# Patient Record
Sex: Female | Born: 1990 | Race: Black or African American | Hispanic: No | Marital: Married | Smoking: Never smoker
Health system: Southern US, Community
[De-identification: ages and names within clinical notes are randomized; demographics above are authoritative.]

## PROBLEM LIST (undated history)

## (undated) DIAGNOSIS — N939 Abnormal uterine and vaginal bleeding, unspecified: Secondary | ICD-10-CM

## (undated) DIAGNOSIS — R112 Nausea with vomiting, unspecified: Secondary | ICD-10-CM

## (undated) DIAGNOSIS — G43909 Migraine, unspecified, not intractable, without status migrainosus: Secondary | ICD-10-CM

## (undated) DIAGNOSIS — D509 Iron deficiency anemia, unspecified: Secondary | ICD-10-CM

## (undated) DIAGNOSIS — F32A Depression, unspecified: Secondary | ICD-10-CM

## (undated) DIAGNOSIS — G43109 Migraine with aura, not intractable, without status migrainosus: Secondary | ICD-10-CM

## (undated) DIAGNOSIS — E669 Obesity, unspecified: Secondary | ICD-10-CM

## (undated) DIAGNOSIS — K219 Gastro-esophageal reflux disease without esophagitis: Secondary | ICD-10-CM

## (undated) DIAGNOSIS — F419 Anxiety disorder, unspecified: Secondary | ICD-10-CM

## (undated) DIAGNOSIS — Z9889 Other specified postprocedural states: Secondary | ICD-10-CM

## (undated) HISTORY — DX: Migraine with aura, not intractable, without status migrainosus: G43.109

## (undated) HISTORY — DX: Abnormal uterine and vaginal bleeding, unspecified: N93.9

## (undated) HISTORY — DX: Depression, unspecified: F32.A

## (undated) HISTORY — DX: Anxiety disorder, unspecified: F41.9

## (undated) HISTORY — DX: Migraine, unspecified, not intractable, without status migrainosus: G43.909

## (undated) HISTORY — PX: WISDOM TOOTH EXTRACTION: SHX21

## (undated) HISTORY — PX: ABDOMINAL HYSTERECTOMY: SHX81

---

## 2012-06-19 HISTORY — PX: TUBAL LIGATION: SHX77

## 2013-06-19 HISTORY — PX: DILATION AND CURETTAGE, DIAGNOSTIC / THERAPEUTIC: SUR384

## 2016-12-30 ENCOUNTER — Encounter (HOSPITAL_COMMUNITY): Payer: Self-pay | Admitting: Emergency Medicine

## 2016-12-30 ENCOUNTER — Emergency Department (HOSPITAL_COMMUNITY)
Admission: EM | Admit: 2016-12-30 | Discharge: 2016-12-30 | Disposition: A | Discharge status: Home / Self Care | Payer: No Typology Code available for payment source | Attending: Emergency Medicine | Admitting: Emergency Medicine

## 2016-12-30 DIAGNOSIS — G43909 Migraine, unspecified, not intractable, without status migrainosus: Secondary | ICD-10-CM

## 2016-12-30 DIAGNOSIS — Y92410 Unspecified street and highway as the place of occurrence of the external cause: Secondary | ICD-10-CM | POA: Insufficient documentation

## 2016-12-30 DIAGNOSIS — Y939 Activity, unspecified: Secondary | ICD-10-CM | POA: Insufficient documentation

## 2016-12-30 DIAGNOSIS — Y999 Unspecified external cause status: Secondary | ICD-10-CM | POA: Diagnosis not present

## 2016-12-30 DIAGNOSIS — M7918 Myalgia, other site: Secondary | ICD-10-CM

## 2016-12-30 DIAGNOSIS — S39012A Strain of muscle, fascia and tendon of lower back, initial encounter: Secondary | ICD-10-CM

## 2016-12-30 DIAGNOSIS — M791 Myalgia: Secondary | ICD-10-CM | POA: Diagnosis not present

## 2016-12-30 DIAGNOSIS — S3992XA Unspecified injury of lower back, initial encounter: Secondary | ICD-10-CM | POA: Diagnosis not present

## 2016-12-30 DIAGNOSIS — S3982XA Other specified injuries of lower back, initial encounter: Secondary | ICD-10-CM | POA: Diagnosis present

## 2016-12-30 DIAGNOSIS — S3092XA Unspecified superficial injury of abdominal wall, initial encounter: Secondary | ICD-10-CM | POA: Diagnosis not present

## 2016-12-30 MED ORDER — SUMATRIPTAN SUCCINATE 6 MG/0.5ML ~~LOC~~ SOLN
6.0000 mg | Freq: Once | SUBCUTANEOUS | Status: AC
  Administered 2016-12-30: 6 mg via SUBCUTANEOUS
  Filled 2016-12-30: qty 0.5

## 2016-12-30 MED ORDER — CYCLOBENZAPRINE HCL 5 MG PO TABS: 10.0000 mg | ORAL_TABLET | Freq: Three times a day (TID) | ORAL | 0 refills | Status: DC | PRN

## 2016-12-30 MED ORDER — CYCLOBENZAPRINE HCL 10 MG PO TABS
5.0000 mg | ORAL_TABLET | Freq: Once | ORAL | Status: AC
  Administered 2016-12-30: 5 mg via ORAL
  Filled 2016-12-30: qty 1

## 2016-12-30 MED ORDER — IBUPROFEN 400 MG PO TABS
600.0000 mg | ORAL_TABLET | Freq: Once | ORAL | Status: AC
  Administered 2016-12-30: 600 mg via ORAL
  Filled 2016-12-30: qty 1

## 2016-12-30 MED ORDER — IBUPROFEN 600 MG PO TABS: 600.0000 mg | ORAL_TABLET | Freq: Four times a day (QID) | ORAL | 0 refills | Status: DC | PRN
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
## 2016-12-30 NOTE — ED Notes (Signed)
ED Provider at bedside. 
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
## 2017-01-04 ENCOUNTER — Encounter: Payer: Self-pay | Admitting: Adult Health

## 2017-01-04 ENCOUNTER — Ambulatory Visit (INDEPENDENT_AMBULATORY_CARE_PROVIDER_SITE_OTHER): Payer: No Typology Code available for payment source | Admitting: Adult Health

## 2017-01-04 VITALS — BP 110/70 | HR 108 | Temp 99.0°F | Wt 252.8 lb

## 2017-01-04 DIAGNOSIS — R109 Unspecified abdominal pain: Secondary | ICD-10-CM | POA: Diagnosis not present

## 2017-01-04 DIAGNOSIS — Z7689 Persons encountering health services in other specified circumstances: Secondary | ICD-10-CM
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
## 2017-01-04 NOTE — Patient Instructions (Signed)
It was great meeting you today   I will follow up with you regarding your blood work and cat scan   Please follow up with me for your complete physical

## 2017-01-05 ENCOUNTER — Ambulatory Visit (INDEPENDENT_AMBULATORY_CARE_PROVIDER_SITE_OTHER)
Admission: RE | Admit: 2017-01-05 | Discharge: 2017-01-05 | Disposition: A | Discharge status: Home / Self Care | Payer: No Typology Code available for payment source | Source: Ambulatory Visit | Attending: Adult Health | Admitting: Adult Health

## 2017-01-05 ENCOUNTER — Other Ambulatory Visit: Payer: Self-pay | Admitting: Adult Health

## 2017-01-05 DIAGNOSIS — R109 Unspecified abdominal pain: Secondary | ICD-10-CM | POA: Diagnosis not present

## 2017-01-05 LAB — CBC WITH DIFFERENTIAL/PLATELET
BASOS PCT: 1.3 % (ref 0.0–3.0)
Basophils Absolute: 0.1 10*3/uL (ref 0.0–0.1)
EOS PCT: 1.5 % (ref 0.0–5.0)
Eosinophils Absolute: 0.1 10*3/uL (ref 0.0–0.7)
HCT: 39.7 % (ref 36.0–46.0)
Hemoglobin: 12.7 g/dL (ref 12.0–15.0)
LYMPHS ABS: 2.2 10*3/uL (ref 0.7–4.0)
Lymphocytes Relative: 23.7 % (ref 12.0–46.0)
MCHC: 31.9 g/dL (ref 30.0–36.0)
MCV: 87.3 fl (ref 78.0–100.0)
MONOS PCT: 7.3 % (ref 3.0–12.0)
Monocytes Absolute: 0.7 10*3/uL (ref 0.1–1.0)
NEUTROS ABS: 6.3 10*3/uL (ref 1.4–7.7)
NEUTROS PCT: 66.2 % (ref 43.0–77.0)
PLATELETS: 331 10*3/uL (ref 150.0–400.0)
RBC: 4.55 Mil/uL (ref 3.87–5.11)
RDW: 13.7 % (ref 11.5–15.5)
WBC: 9.5 10*3/uL (ref 4.0–10.5)

## 2017-01-05 LAB — BASIC METABOLIC PANEL
BUN: 13 mg/dL (ref 6–23)
CALCIUM: 9 mg/dL (ref 8.4–10.5)
CO2: 29 meq/L (ref 19–32)
CREATININE: 0.75 mg/dL (ref 0.40–1.20)
Chloride: 103 mEq/L (ref 96–112)
GFR: 120.61 mL/min (ref >60.00)
Glucose, Bld: 88 mg/dL (ref 70–99)
Potassium: 4.2 mEq/L (ref 3.5–5.1)
Sodium: 138 mEq/L (ref 135–145)

## 2017-01-05 MED ORDER — IOPAMIDOL (ISOVUE-300) INJECTION 61%: 100.0000 mL | Freq: Once | INTRAVENOUS | Status: DC | PRN

## 2017-01-25 ENCOUNTER — Encounter: Payer: No Typology Code available for payment source | Admitting: Adult Health

## 2017-02-15 ENCOUNTER — Encounter: Payer: No Typology Code available for payment source | Admitting: Adult Health

## 2017-03-09 ENCOUNTER — Encounter: Payer: Self-pay | Admitting: Adult Health

## 2017-03-11 ENCOUNTER — Encounter (HOSPITAL_COMMUNITY): Payer: Self-pay | Admitting: Emergency Medicine

## 2017-03-11 ENCOUNTER — Emergency Department (HOSPITAL_COMMUNITY)
Admission: EM | Admit: 2017-03-11 | Discharge: 2017-03-11 | Disposition: A | Discharge status: Home / Self Care | Payer: Self-pay | Attending: Emergency Medicine | Admitting: Emergency Medicine

## 2017-03-11 ENCOUNTER — Emergency Department (HOSPITAL_COMMUNITY): Payer: Self-pay

## 2017-03-11 DIAGNOSIS — Z79899 Other long term (current) drug therapy: Secondary | ICD-10-CM | POA: Insufficient documentation

## 2017-03-11 DIAGNOSIS — Y998 Other external cause status: Secondary | ICD-10-CM | POA: Insufficient documentation

## 2017-03-11 DIAGNOSIS — W101XXA Fall (on)(from) sidewalk curb, initial encounter: Secondary | ICD-10-CM | POA: Insufficient documentation

## 2017-03-11 DIAGNOSIS — S8392XA Sprain of unspecified site of left knee, initial encounter: Secondary | ICD-10-CM | POA: Insufficient documentation

## 2017-03-11 DIAGNOSIS — Y92480 Sidewalk as the place of occurrence of the external cause: Secondary | ICD-10-CM | POA: Insufficient documentation

## 2017-03-11 DIAGNOSIS — Y9302 Activity, running: Secondary | ICD-10-CM | POA: Insufficient documentation

## 2017-03-11 MED ORDER — NAPROXEN 500 MG PO TABS: 500.0000 mg | ORAL_TABLET | Freq: Two times a day (BID) | ORAL | 0 refills | Status: DC

## 2017-03-11 NOTE — ED Triage Notes (Signed)
Pt states she was running, she fell and twisted her left knee pt having 9/10 pain at this time, no deformity noticed on triage.

## 2017-03-11 NOTE — ED Provider Notes (Signed)
[facility] Provider Note   CSN: [id number] Arrival date & time: 03/11/17  0148     History   Chief Complaint Chief Complaint  Patient presents with  . Knee Injury    HPI Lori [name] is a 26 y.o. female.  Patient presents with complaint of acute onset of left knee pain starting at 6 PM yesterday. Patient states that she was running and fell on a sidewalk. She twisted her left knee and landed on her right knee. She has taken ibuprofen prior to arrival without relief. No numbness or tingling distal to the knee. She is able to walk but it hurts. The onset of this condition was acute. The course is constant. Aggravating factors: none. Alleviating factors: none.        Past Medical History:  Diagnosis Date  . Migraine     There are no active problems to display for this patient.   Past Surgical History:  Procedure Laterality Date  . ABLATION  2015  . TUBAL LIGATION  2014    OB History    No data available       Home Medications    Prior to Admission medications   Medication Sig Start Date End Date Taking? Authorizing Provider  amitriptyline (ELAVIL) 10 MG tablet Take 10 mg by mouth at bedtime.    [provider]  naproxen (NAPROSYN) 500 MG tablet Take 1 tablet (500 mg total) by mouth 2 (two) times daily. 03/11/17   Renne Crigler, PA-C  rizatriptan (MAXALT) 10 MG tablet Take 10 mg by mouth as needed for migraine. May repeat in 2 hours if needed    [provider]    Family History Family History  Problem Relation Age of Onset  . Diabetes Mother   . Heart disease Mother   . Diabetes Father   . Kidney disease Father   . Asthma Sister   . Seizures Sister   . Asthma Daughter   . Von Willebrand disease Son   . Asthma Sister   . Diabetes Maternal Grandmother   . Heart disease Maternal Grandmother   . Diabetes Maternal Grandfather   . Diabetes Paternal Grandmother   . Diabetes Paternal Grandfather     Social History Social  History  Substance Use Topics  . Smoking status: Never Smoker  . Smokeless tobacco: Never Used  . Alcohol use Yes     Comment: social     Allergies   Benylin adult formula [dextromethorphan]; Codeine; and Tramadol   Review of Systems Review of Systems  Constitutional: Negative for activity change.  Musculoskeletal: Positive for arthralgias and gait problem. Negative for back pain, joint swelling and neck pain.  Skin: Negative for wound.  Neurological: Negative for weakness and numbness.     Physical Exam Updated Vital Signs BP 119/61 (BP Location: Right Arm)   Pulse 89   Temp 97.6 F (36.4 C) (Oral)   Resp 17   Ht  (1.651 m)   Wt 111.1 kg (245 lb)   SpO2 100%   BMI 40.77 kg/m   Physical Exam  Constitutional: She appears well-developed and well-nourished.  HENT:  Head: Normocephalic and atraumatic.  Mouth/Throat: Oropharynx is clear and moist.  Eyes: Pupils are equal, round, and reactive to light.  Neck: Normal range of motion. Neck supple.  Cardiovascular: Normal pulses.  Exam reveals no decreased pulses.   Musculoskeletal: She exhibits tenderness. She exhibits no edema.       Left hip: She exhibits normal range of  motion, normal strength and no tenderness.       Right knee: She exhibits normal range of motion, no swelling and no effusion.       Left knee: She exhibits normal range of motion, no swelling and no effusion. Tenderness found. Medial joint line and lateral joint line tenderness noted.       Left ankle: She exhibits normal range of motion and no swelling. No tenderness.  Neurological: She is alert. No sensory deficit.  Motor, sensation, and vascular distal to the injury is fully intact.   Skin: Skin is warm and dry.  Psychiatric: She has a normal mood and affect.  Nursing note and vitals reviewed.    ED Treatments / Results   Radiology Dg Knee Complete 4 Views Left  Result Date: 03/11/2017 CLINICAL DATA:  Patient fell yesterday at 6 p.m.  Twisting injury to the left knee. Left knee pain. EXAM: LEFT KNEE - COMPLETE 4+ VIEW COMPARISON:  None. FINDINGS: No evidence of fracture, dislocation, or joint effusion. No evidence of arthropathy or other focal bone abnormality. Soft tissues are unremarkable. IMPRESSION: Negative. Electronically Signed   By: Burman Nieves M.D.   On: 03/11/2017 03:23    Procedures Procedures (including critical care time)  Medications Ordered in ED Medications - No data to display   Initial Impression / Assessment and Plan / ED Course  I have reviewed the triage vital signs and the nursing notes.  Pertinent labs & imaging results that were available during my care of the patient were reviewed by me and considered in my medical decision making (see chart for details).     Patient seen and examined. Patient updated on x-ray results. Will provide with Ace wrap and crutches. Encouraged orthopedic follow-up if not improved in one week.  Vital signs reviewed and are as follows: BP 119/61 (BP Location: Right Arm)   Pulse 89   Temp 97.6 F (36.4 C) (Oral)   Resp 17   Ht  (1.651 m)   Wt 111.1 kg (245 lb)   SpO2 100%   BMI 40.77 kg/m   Patient was counseled on RICE protocol and told to rest injury, use ice for no longer than 15 minutes every hour, compress the area, and elevate above the level of their heart as much as possible to reduce swelling. Questions answered. Patient verbalized understanding.     Final Clinical Impressions(s) / ED Diagnoses   Final diagnoses:  Sprain of left knee, unspecified ligament, initial encounter   Patient with twisting injury of left knee. Distal extremity is neurovascularly intact. Suspect sprain. Rice protocol indicated.   New Prescriptions New Prescriptions   NAPROXEN (NAPROSYN) 500 MG TABLET    Take 1 tablet (500 mg total) by mouth 2 (two) times daily.     Renne Crigler, PA-C 03/11/17 1610    Margarita Grizzle, MD 03/12/17 [phone]

## 2017-03-11 NOTE — Discharge Instructions (Signed)
Please read and follow all provided instructions.  Your diagnoses today include:  1. Sprain of left knee, unspecified ligament, initial encounter     Tests performed today include:  An x-ray of the affected area - does NOT show any broken bones  Vital signs. See below for your results today.   Medications prescribed:   Naproxen - anti-inflammatory pain medication  Do not exceed  naproxen every 12 hours, take with food  You have been prescribed an anti-inflammatory medication or NSAID. Take with food. Take smallest effective dose for the shortest duration needed for your pain. Stop taking if you experience stomach pain or vomiting.   Take any prescribed medications only as directed.  Home care instructions:   Follow any educational materials contained in this packet  Follow R.I.C.E. Protocol:  R - rest your injury   I  - use ice on injury without applying directly to skin  C - compress injury with bandage or splint  E - elevate the injury as much as possible  Follow-up instructions: Please follow-up with your primary care provider or the provided orthopedic physician (bone specialist) if you continue to have significant pain in 1 week. In this case you may have a more severe injury that requires further care.   Return instructions:   Please return if your toes or feet are numb or tingling, appear gray or blue, or you have severe pain (also elevate the leg and loosen splint or wrap if you were given one)  Please return to the Emergency Department if you experience worsening symptoms.   Please return if you have any other emergent concerns.  Additional Information:  Your vital signs today were: BP 119/61 (BP Location: Right Arm)    Pulse 89    Temp 97.6 F (36.4 C) (Oral)    Resp 17    Ht  (1.651 m)    Wt 111.1 kg (245 lb)    SpO2 100%    BMI 40.77 kg/m  If your blood pressure (BP) was elevated above 135/85 this visit, please have this repeated by your  doctor within one month. -------------- If prescribed crutches for your injury: use crutches with non-weight bearing for the first few days. Then, you may walk as the pain allows, or as instructed. Start gradually with weight bearing on the affected side. Once you can walk pain free, then try jogging. When you can run forwards, then you can try moving side-to-side. If you cannot walk without crutches in one week, you need a re-check. --------------

## 2017-03-27 DIAGNOSIS — R51 Headache: Secondary | ICD-10-CM | POA: Diagnosis not present

## 2017-03-27 DIAGNOSIS — S3992XA Unspecified injury of lower back, initial encounter: Secondary | ICD-10-CM | POA: Diagnosis not present

## 2017-03-27 DIAGNOSIS — M549 Dorsalgia, unspecified: Secondary | ICD-10-CM | POA: Diagnosis not present

## 2017-03-27 DIAGNOSIS — S8992XA Unspecified injury of left lower leg, initial encounter: Secondary | ICD-10-CM | POA: Diagnosis not present

## 2017-03-27 DIAGNOSIS — M542 Cervicalgia: Secondary | ICD-10-CM | POA: Diagnosis not present

## 2017-03-27 DIAGNOSIS — S199XXA Unspecified injury of neck, initial encounter: Secondary | ICD-10-CM | POA: Diagnosis not present

## 2017-03-27 DIAGNOSIS — S0990XA Unspecified injury of head, initial encounter: Secondary | ICD-10-CM | POA: Diagnosis not present

## 2017-03-27 DIAGNOSIS — M25562 Pain in left knee: Secondary | ICD-10-CM | POA: Diagnosis not present

## 2017-03-28 MED FILL — IBUPROFEN 800 MG TABS: 800 | 7 days supply | Qty: 30 | Fill #0

## 2017-03-28 MED FILL — METHOCARBAMOL 500 MG TABS: 500 | 10 days supply | Qty: 40 | Fill #0

## 2017-04-03 ENCOUNTER — Encounter: Payer: Self-pay | Admitting: Adult Health

## 2017-04-03 ENCOUNTER — Ambulatory Visit (INDEPENDENT_AMBULATORY_CARE_PROVIDER_SITE_OTHER): Payer: 59 | Admitting: Adult Health

## 2017-04-03 VITALS — BP 90/70 | Temp 98.3°F | Ht 65.5 in | Wt 257.0 lb

## 2017-04-03 DIAGNOSIS — Z23 Encounter for immunization: Secondary | ICD-10-CM | POA: Diagnosis not present

## 2017-04-03 DIAGNOSIS — Z0000 Encounter for general adult medical examination without abnormal findings: Secondary | ICD-10-CM | POA: Diagnosis not present

## 2017-04-03 LAB — LIPID PANEL
Cholesterol: 164 mg/dL (ref 0–200)
HDL: 46.9 mg/dL (ref >39.00)
LDL CALC: 107 mg/dL — AB (ref 0–99)
NONHDL: 117.04
Total CHOL/HDL Ratio: 3
Triglycerides: 51 mg/dL (ref 0.0–149.0)
VLDL: 10.2 mg/dL (ref 0.0–40.0)

## 2017-04-03 LAB — HEPATIC FUNCTION PANEL
ALBUMIN: 3.9 g/dL (ref 3.5–5.2)
ALK PHOS: 112 U/L (ref 39–117)
ALT: 18 U/L (ref 0–35)
AST: 12 U/L (ref 0–37)
Bilirubin, Direct: 0.1 mg/dL (ref 0.0–0.3)
Total Bilirubin: 0.3 mg/dL (ref 0.2–1.2)
Total Protein: 6.9 g/dL (ref 6.0–8.3)

## 2017-04-03 LAB — CBC WITH DIFFERENTIAL/PLATELET
BASOS PCT: 0.7 % (ref 0.0–3.0)
Basophils Absolute: 0 10*3/uL (ref 0.0–0.1)
EOS PCT: 1.7 % (ref 0.0–5.0)
Eosinophils Absolute: 0.1 10*3/uL (ref 0.0–0.7)
HCT: 40.8 % (ref 36.0–46.0)
HEMOGLOBIN: 13.2 g/dL (ref 12.0–15.0)
LYMPHS ABS: 2.2 10*3/uL (ref 0.7–4.0)
Lymphocytes Relative: 29.5 % (ref 12.0–46.0)
MCHC: 32.4 g/dL (ref 30.0–36.0)
MCV: 86.4 fl (ref 78.0–100.0)
MONO ABS: 0.6 10*3/uL (ref 0.1–1.0)
Monocytes Relative: 7.7 % (ref 3.0–12.0)
NEUTROS ABS: 4.4 10*3/uL (ref 1.4–7.7)
NEUTROS PCT: 60.4 % (ref 43.0–77.0)
PLATELETS: 310 10*3/uL (ref 150.0–400.0)
RBC: 4.72 Mil/uL (ref 3.87–5.11)
RDW: 13.2 % (ref 11.5–15.5)
WBC: 7.3 10*3/uL (ref 4.0–10.5)

## 2017-04-03 LAB — BASIC METABOLIC PANEL
BUN: 14 mg/dL (ref 6–23)
CO2: 27 mEq/L (ref 19–32)
Calcium: 9 mg/dL (ref 8.4–10.5)
Chloride: 104 mEq/L (ref 96–112)
Creatinine, Ser: 0.63 mg/dL (ref 0.40–1.20)
GFR: 147.21 mL/min (ref >60.00)
GLUCOSE: 89 mg/dL (ref 70–99)
Potassium: 4.4 mEq/L (ref 3.5–5.1)
SODIUM: 138 meq/L (ref 135–145)

## 2017-04-03 LAB — TSH: TSH: 1.04 u[IU]/mL (ref 0.35–4.50)

## 2017-04-03 LAB — HEMOGLOBIN A1C: HEMOGLOBIN A1C: 5.5 % (ref 4.6–6.5)

## 2017-04-03 NOTE — Progress Notes (Signed)
Subjective:    Patient ID: Lori [name], female    DOB: 22-Apr-1991, 26 y.o.   MRN: [id number]  HPI  Patient presents for yearly preventative medicine examination. She is a pleasant 26 year old female who  has a past medical history of Migraine.  Migraines - She takes Maxalt and Amitriptyline when needed - stable   All immunizations and health maintenance protocols were reviewed with the patient and needed orders were placed.  Appropriate screening laboratory values were ordered for the patient including screening of hyperlipidemia, renal function and hepatic function.  Medication reconciliation,  past medical history, social history, problem list and allergies were reviewed in detail with the patient  Goals were established with regard to weight loss, exercise, and  diet in compliance with medications. She is not exercising on a regular basis nor does she always follow a heart healthy diet.   Wt Readings from Last 3 Encounters:  04/03/17 257 lb (116.6 kg)  03/11/17 245 lb (111.1 kg)  01/04/17 252 lb 12.8 oz (114.7 kg)   She will follow up with gyn for her pap smear  Review of Systems  Constitutional: Negative.   HENT: Negative.   Eyes: Negative.   Respiratory: Negative.   Cardiovascular: Negative.   Gastrointestinal: Negative.   Endocrine: Negative.   Genitourinary: Negative.   Musculoskeletal: Negative.   Skin: Negative.   Allergic/Immunologic: Negative.   Neurological: Negative.   Psychiatric/Behavioral: Negative.   All other systems reviewed and are negative.  Past Medical History:  Diagnosis Date  . Migraine     Social History   Social History  . Marital status: Single    Spouse name: N/A  . Number of children: N/A  . Years of education: N/A   Occupational History  . Not on file.   Social History Main Topics  . Smoking status: Never Smoker  . Smokeless tobacco: Never Used  . Alcohol use Yes     Comment: social  . Drug use: No  . Sexual  activity: Not on file   Other Topics Concern  . Not on file   Social History Narrative   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   Two children ( daughter 67 , son 3)           Past Surgical History:  Procedure Laterality Date  . ABLATION  2015  . TUBAL LIGATION  2014    Family History  Problem Relation Age of Onset  . Diabetes Mother   . Heart disease Mother   . Diabetes Father   . Kidney disease Father   . Asthma Sister   . Seizures Sister   . Asthma Daughter   . Von Willebrand disease Son   . Asthma Sister   . Diabetes Maternal Grandmother   . Heart disease Maternal Grandmother   . Diabetes Maternal Grandfather   . Diabetes Paternal Grandmother   . Diabetes Paternal Grandfather     Allergies  Allergen Reactions  . Benylin Adult Formula [Dextromethorphan] Shortness Of Breath  . Benadryl [Diphenhydramine Hcl] Hives  . Codeine Itching  . Tramadol Itching    Current Outpatient Prescriptions on File Prior to Visit  Medication Sig Dispense Refill  . amitriptyline (ELAVIL) 10 MG tablet Take 10 mg by mouth at bedtime.    . naproxen (NAPROSYN) 500 MG tablet Take 1 tablet (500 mg total) by mouth 2 (two) times daily. 30 tablet 0  . rizatriptan (MAXALT) 10 MG tablet Take 10 mg  by mouth as needed for migraine. May repeat in 2 hours if needed     No current facility-administered medications on file prior to visit.     BP 90/70 (BP Location: Right Arm)   Temp 98.3 F (36.8 C) (Oral)   Ht 5' 5.5" (1.664 m)   Wt 257 lb (116.6 kg)   BMI 42.12 kg/m       Objective:   Physical Exam  Constitutional: She is oriented to person, place, and time. She appears well-developed and well-nourished. No distress.  Obese   HENT:  Head: Normocephalic and atraumatic.  Right Ear: External ear normal.  Left Ear: External ear normal.  Nose: Nose normal.  Mouth/Throat: Oropharynx is clear and moist. No oropharyngeal exudate.  Eyes: Pupils are equal, round, and reactive to  light. Conjunctivae and EOM are normal. Right eye exhibits no discharge. Left eye exhibits no discharge. No scleral icterus.  Neck: Normal range of motion. Neck supple. No JVD present. No tracheal deviation present. No thyromegaly present.  Cardiovascular: Normal rate, regular rhythm, normal heart sounds and intact distal pulses.  Exam reveals no gallop and no friction rub.   No murmur heard. Pulmonary/Chest: Effort normal and breath sounds normal. No stridor. No respiratory distress. She has no wheezes. She has no rales. She exhibits no tenderness.  Abdominal: Soft. Bowel sounds are normal. She exhibits no distension and no mass. There is no tenderness. There is no rebound and no guarding.  Musculoskeletal: Normal range of motion. She exhibits no edema, tenderness or deformity.  Lymphadenopathy:    She has no cervical adenopathy.  Neurological: She is alert and oriented to person, place, and time. She has normal reflexes. She displays normal reflexes. No cranial nerve deficit. She exhibits normal muscle tone. Coordination normal.  Skin: Skin is warm and dry. No rash noted. She is not diaphoretic. No erythema. No pallor.  Psychiatric: She has a normal mood and affect. Her behavior is normal. Judgment and thought content normal.  Nursing note and vitals reviewed.     Assessment & Plan:  1. Routine general medical examination at a health care facility - CBC with Differential/Platelet - Basic metabolic panel - Hemoglobin A1c - Hepatic function panel - Lipid panel - TSH  2. Need for influenza vaccination  - Flu Vaccine QUAD 6+ mos PF IM (Fluarix Quad PF)  3. Need for Tdap vaccination  - Tdap vaccine greater than or equal to 7yo IM  4. Morbid obesity (HCC) - Encouraged lift style modification    Shirline Frees, NP

## 2017-04-03 NOTE — Patient Instructions (Signed)
It was great seeing you today   I will follow up with you regarding your labs   Please follow up with GYN

## 2017-04-04 ENCOUNTER — Other Ambulatory Visit (HOSPITAL_COMMUNITY)
Admission: RE | Admit: 2017-04-04 | Discharge: 2017-04-04 | Disposition: A | Discharge status: Home / Self Care | Payer: 59 | Source: Ambulatory Visit | Attending: Obstetrics and Gynecology | Admitting: Obstetrics and Gynecology

## 2017-04-04 ENCOUNTER — Ambulatory Visit (INDEPENDENT_AMBULATORY_CARE_PROVIDER_SITE_OTHER): Payer: 59 | Admitting: Obstetrics and Gynecology

## 2017-04-04 ENCOUNTER — Encounter: Payer: Self-pay | Admitting: Obstetrics and Gynecology

## 2017-04-04 VITALS — BP 112/78 | HR 84 | Resp 16 | Ht 65.0 in | Wt 255.0 lb

## 2017-04-04 DIAGNOSIS — Z113 Encounter for screening for infections with a predominantly sexual mode of transmission: Secondary | ICD-10-CM | POA: Insufficient documentation

## 2017-04-04 DIAGNOSIS — N898 Other specified noninflammatory disorders of vagina: Secondary | ICD-10-CM

## 2017-04-04 DIAGNOSIS — G43109 Migraine with aura, not intractable, without status migrainosus: Secondary | ICD-10-CM

## 2017-04-04 DIAGNOSIS — Z124 Encounter for screening for malignant neoplasm of cervix: Secondary | ICD-10-CM | POA: Diagnosis not present

## 2017-04-04 DIAGNOSIS — Z01419 Encounter for gynecological examination (general) (routine) without abnormal findings: Secondary | ICD-10-CM | POA: Diagnosis not present

## 2017-04-04 DIAGNOSIS — Z30432 Encounter for removal of intrauterine contraceptive device: Secondary | ICD-10-CM

## 2017-04-04 NOTE — Addendum Note (Signed)
Addended by: Shelda JakesHANNER, MARTHA E on: 04/04/2017 12:54 PM   Modules accepted: Orders

## 2017-04-04 NOTE — Progress Notes (Signed)
26 y.o. [id number] SingleAfrican AmericanF here for annual exam. Patient also wants to discuss having her IUD removed and having a tubal reversal. She had a PP tubal ligation in 11/14 after the birth of her son. From 11/14-5/16 she was having continued heavy bleeding. She had a D&C in 5/16 and had the mirena placed a few weeks later. Since the mirena was placed she has monthly spotting with severe cramps. She didn't have these cramps prior to the IUD. Cramps up to an 8/10 in severity, ibuprofen helps. Prior to her children her cycles were normal. She would like the IUD pulled.  She is sexually active, no dyspareunia. Same partner x 1 year, interested in tubal ligation.      No LMP recorded. Patient is not currently having periods (Reason: IUD).          Sexually active: Yes.    The current method of family planning is IUD/ tubal ligation.    Exercising: No.  The patient does not participate in regular exercise at present. Smoker:  no  Health Maintenance: Pap:  2016 WNL per patient  History of abnormal Pap:  no TDaP:  04-03-17 Gardasil: N/A   reports that she has never smoked. She has never used smokeless tobacco. She reports that she drinks alcohol. She reports that she does not use drugs. She is a Lawyer[employer]NA.  Daughter is 8 and son is 4.   Past Medical History:  Diagnosis Date  . Abnormal uterine bleeding   . Migraine with aura   fractured her coccyx with birth of her daughter.   Past Surgical History:  Procedure Laterality Date  . DILATION AND CURETTAGE, DIAGNOSTIC / THERAPEUTIC  2015  . TUBAL LIGATION  2014    Current Outpatient Prescriptions  Medication Sig Dispense Refill  . levonorgestrel (MIRENA) 20 MCG/24HR IUD 1 each by Intrauterine route once.    . naproxen (NAPROSYN) 500 MG tablet Take 1 tablet (500 mg total) by mouth 2 (two) times daily. 30 tablet 0  . rizatriptan (MAXALT) 10 MG tablet Take 10 mg by mouth as needed for migraine. May repeat in 2 hours if needed     No current  facility-administered medications for this visit.     Family History  Problem Relation Age of Onset  . Diabetes Mother   . Heart disease Mother   . Diabetes Father   . Kidney disease Father   . Asthma Sister   . Seizures Sister   . Asthma Daughter   . Von Willebrand disease Son   . Asthma Sister   . Diabetes Maternal Grandmother   . Heart disease Maternal Grandmother   . Diabetes Maternal Grandfather   . Diabetes Paternal Grandmother   . Lupus Paternal Grandmother   . Diabetes Paternal Grandfather   Son's Von Willebrand Disease, from his Dad's Side.   Review of Systems  Constitutional: Negative.   HENT: Negative.   Eyes: Negative.   Respiratory: Negative.   Cardiovascular: Negative.   Gastrointestinal: Negative.   Endocrine: Negative.   Genitourinary: Negative.   Musculoskeletal: Negative.   Skin: Negative.   Allergic/Immunologic: Negative.   Neurological: Negative.   Psychiatric/Behavioral: Negative.     Exam:   BP 112/78 (BP Location: Right Arm, Patient Position: Sitting, Cuff Size: Large)   Pulse 84   Resp 16   Ht 5\' 5"  (1.651 m)   Wt 255 lb (115.7 kg)   BMI 42.43 kg/m   Weight change: @WEIGHTCHANGE @ Height:   Height: 5\' 5"  (165.1 cm)  Ht Readings from Last 3 Encounters:  04/04/17 5\' 5"  (1.651 m)  04/03/17 5' 5.5" (1.664 m)  03/11/17 5\' 5"  (1.651 m)    General appearance: alert, cooperative and appears stated age Head: Normocephalic, without obvious abnormality, atraumatic Neck: no adenopathy, supple, symmetrical, trachea midline and thyroid normal to inspection and palpation Lungs: clear to auscultation bilaterally Cardiovascular: regular rate and rhythm Breasts: normal appearance, no masses or tenderness Abdomen: soft, non-tender; non distended,  no masses,  no organomegaly Extremities: extremities normal, atraumatic, no cyanosis or edema Skin: Skin color, texture, turgor normal. No rashes or lesions Lymph nodes: Cervical, supraclavicular, and  axillary nodes normal. No abnormal inguinal nodes palpated Neurologic: Grossly normal   Pelvic: External genitalia:  no lesions              Urethra:  normal appearing urethra with no masses, tenderness or lesions              Bartholins and Skenes: normal                 Vagina: normal appearing vagina with an increase in watery, white vaginal D/C (on questioning the patient does c/o an increase in d/c, denies odor)              Cervix: no lesions, IUD string 2 cm, IUD removed               Bimanual Exam:  Uterus:  normal size, contour, position, consistency, mobility, non-tender              Adnexa: no mass, fullness, tenderness               Rectovaginal: Confirms               Anus:  normal sphincter tone, no lesions  Chaperone was present for exam.  A:  Well Woman with normal exam  Severe cramps since the IUD was placed  Desires tubal reversal  Vaginal discharge  P:   Pap with reflex hpv  Vaginitis panel  STD testing  Remove IUD  Calendar cycles, f/u if not normal  Will refer to Dr April Manson for consultation for tubal reversal  Discussed breast self exam  Discussed calcium and vit D intake  Discussed Gardasil, information given   CC: Dr April Manson

## 2017-04-04 NOTE — Patient Instructions (Signed)
EXERCISE AND DIET:  We recommended that you start or continue a regular exercise program for good health. Regular exercise means any activity that makes your heart beat faster and makes you sweat.  We recommend exercising at least 30 minutes per day at least 3 days a week, preferably 4 or 5.  We also recommend a diet low in fat and sugar.  Inactivity, poor dietary choices and obesity can cause diabetes, heart attack, stroke, and kidney damage, among others.    ALCOHOL AND SMOKING:  Women should limit their alcohol intake to no more than 7 drinks/beers/glasses of wine (combined, not each!) per week. Moderation of alcohol intake to this level decreases your risk of breast cancer and liver damage. And of course, no recreational drugs are part of a healthy lifestyle.  And absolutely no smoking or even second hand smoke. Most people know smoking can cause heart and lung diseases, but did you know it also contributes to weakening of your bones? Aging of your skin?  Yellowing of your teeth and nails?  CALCIUM AND VITAMIN D:  Adequate intake of calcium and Vitamin D are recommended.  The recommendations for exact amounts of these supplements seem to change often, but generally speaking 600 mg of calcium (either carbonate or citrate) and 800 units of Vitamin D per day seems prudent. Certain women may benefit from higher intake of Vitamin D.  If you are among these women, your doctor will have told you during your visit.    PAP SMEARS:  Pap smears, to check for cervical cancer or precancers,  have traditionally been done yearly, although recent scientific advances have shown that most women can have pap smears less often.  However, every woman still should have a physical exam from her gynecologist every year. It will include a breast check, inspection of the vulva and vagina to check for abnormal growths or skin changes, a visual exam of the cervix, and then an exam to evaluate the size and shape of the uterus and  ovaries.  And after 25 years of age, a rectal exam is indicated to check for rectal cancers. We will also provide age appropriate advice regarding health maintenance, like when you should have certain vaccines, screening for sexually transmitted diseases, bone density testing, colonoscopy, mammograms, etc.   MAMMOGRAMS:  All women over 40 years old should have a yearly mammogram. Many facilities now offer a "3D" mammogram, which may cost around $50 extra out of pocket. If possible,  we recommend you accept the option to have the 3D mammogram performed.  It both reduces the number of women who will be called back for extra views which then turn out to be normal, and it is better than the routine mammogram at detecting truly abnormal areas.    COLONOSCOPY:  Colonoscopy to screen for colon cancer is recommended for all women at age 50.  We know, you hate the idea of the prep.  We agree, BUT, having colon cancer and not knowing it is worse!!  Colon cancer so often starts as a polyp that can be seen and removed at colonscopy, which can quite literally save your life!  And if your first colonoscopy is normal and you have no family history of colon cancer, most women don't have to have it again for 10 years.  Once every ten years, you can do something that may end up saving your life, right?  We will be happy to help you get it scheduled when you are ready.    Be sure to check your insurance coverage so you understand how much it will cost.  It may be covered as a preventative service at no cost, but you should check your particular policy.     Tubal Ligation Reversal Tubal ligation is a procedure that closes the fallopian tubes to prevent pregnancy. Tubal ligation reversal is the reopening, untying, or reconnecting of separated sections of the fallopian tubes, and reversing the tubal ligation procedure that was originally performed. Success rates of getting pregnant after this procedure depend on:  Your  age.  The type of tubal ligation that you had.  The length of your remaining fallopian tubes.  How healthy the tubes are.  The amount of scar tissue in your pelvic area.  Your partner's fertility.  Tell a health care provider about:  Any allergies you have.  All medicines you are taking, including vitamins, herbs, eye drops, creams, and over-the-counter medicines. This includes any use of steroids, either by mouth or in cream form.  Previous problems you or members of your family have had with the use of anesthetics.  Any blood disorders you have.  Previous surgeries you have had.  Any medical conditions you may have.  Possibility of pregnancy, if this applies. What are the risks?  Failure to reverse the original procedure.  Future blockage of a reopened or reconnected fallopian tube.  Not getting pregnant.  Ectopic pregnancy.  Infection.  Bleeding.  Injury to surrounding organs.  Blood clots in the legs and chest.  Side effects from anesthetics. What happens before the procedure?  You and your partner will likely need physical exams to make sure that you do not have any unknown fertility problems. Blood and imaging tests may also be performed for the same reason.  Ask your health care provider about: ? Changing or stopping your regular medicines. This is especially important if you are taking diabetes medicines or blood thinners. ? Taking medicines such as aspirin and ibuprofen. These medicines can thin your blood. Do not take these medicines before your procedure if your health care provider instructs you not to.  Follow instructions from your health care provider about eating and drinking restrictions.  Plan to have someone take you home after the procedure.  If you go home right after the procedure, plan to have someone with you for 24 hours. What happens during the procedure?  You will be given one or more of the following: ? A medicine that helps  you relax (sedative). ? A medicine that numbs the area (local anesthetic). ? A medicine that makes you fall asleep (general anesthetic). ? A medicine that is injected into an area of your body that numbs everything below the injection site (regional anesthetic).  If you have been given general anesthetic, a tube will be put down your throat to help you breathe.  A thin, lighted tube (laparoscope) with a camera attached will be inserted near your belly button and into the pelvic area. Other instruments will be inserted through small cuts (incisions) in your abdomen.  Clips, rings, or clamps that closed your fallopian tubes will be removed with the surgical instruments. Unconnected sections of your fallopian tubes will be reconnected.  Your fallopian tubes will be tested to make sure that they are open along their whole length.  The incisions will be closed with stitches (sutures).  A bandage (dressing) will be placed over the incisions. The procedure may vary among health care providers and hospitals. What happens after the procedure?  Your blood pressure, heart rate, breathing rate, and blood oxygen level will be monitored often until the medicines you were given have worn off.  You will be given pain medicine as needed.  If you had general anesthetic, you may have some mild discomfort in your throat. This is from the breathing tube that was placed in your throat while you were sleeping.  You will have some mild abdominal discomfort for 3-7 days.  You will need to have a follow-up X-ray dye test about 3-4 months after the surgery to make sure that your tubes are open and working. This information is not intended to replace advice given to you by your health care provider. Make sure you discuss any questions you have with your health care provider. Document Released: 12/05/2011 Document Revised: 05/04/2016 Document Reviewed: 09/16/2011 Elsevier Interactive Patient Education  [employer]2018  Elsevier Inc.

## 2017-04-05 ENCOUNTER — Other Ambulatory Visit: Payer: Self-pay

## 2017-04-05 LAB — HEP, RPR, HIV PANEL
HEP B S AG: NEGATIVE
HIV Screen 4th Generation wRfx: NONREACTIVE
RPR Ser Ql: NONREACTIVE

## 2017-04-05 LAB — HEPATITIS C ANTIBODY: Hep C Virus Ab: <0.1 s/co ratio (ref 0.0–0.9)

## 2017-04-05 LAB — VAGINITIS/VAGINOSIS, DNA PROBE
CANDIDA SPECIES: NEGATIVE
Gardnerella vaginalis: POSITIVE — AB
TRICHOMONAS VAG: NEGATIVE

## 2017-04-05 MED ORDER — METRONIDAZOLE 500 MG PO TABS: 500.0000 mg | ORAL_TABLET | Freq: Two times a day (BID) | ORAL | 0 refills | Status: DC

## 2017-04-05 MED FILL — metroNIDAZOLE 500 MG TABS: 500 | 7 days supply | Qty: 14 | Fill #0

## 2017-04-06 LAB — CYTOLOGY - PAP
CHLAMYDIA, DNA PROBE: NEGATIVE
DIAGNOSIS: UNDETERMINED — AB
HPV (WINDOPATH): DETECTED — AB
NEISSERIA GONORRHEA: NEGATIVE

## 2017-04-08 ENCOUNTER — Telehealth: Payer: Self-pay | Admitting: Obstetrics and Gynecology

## 2017-04-08 NOTE — Telephone Encounter (Signed)
After hours phone call from patient.   Calling with heavy vaginal bleeding.  Had Mirena IUD removed 4 days ago due to pain.  Hx of bilateral tubal ligation.   Reports 10 pad changes yesterday.  Having clotting today. Cramping with clotting.  Taking Ibuprofen 800 mg.  Felt fatigued yesterday.   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  I recommend she be seen for evaluation.  She will decide if [facility] today or call in the am for an appointment.

## 2017-04-09 ENCOUNTER — Encounter (HOSPITAL_COMMUNITY): Payer: Self-pay | Admitting: *Deleted

## 2017-04-09 ENCOUNTER — Inpatient Hospital Stay (HOSPITAL_COMMUNITY)
Admission: AD | Admit: 2017-04-09 | Discharge: 2017-04-09 | Disposition: A | Discharge status: Home / Self Care | Payer: 59 | Source: Ambulatory Visit | Attending: Gynecology | Admitting: Gynecology

## 2017-04-09 DIAGNOSIS — Z3202 Encounter for pregnancy test, result negative: Secondary | ICD-10-CM | POA: Insufficient documentation

## 2017-04-09 DIAGNOSIS — N939 Abnormal uterine and vaginal bleeding, unspecified: Secondary | ICD-10-CM | POA: Insufficient documentation

## 2017-04-09 LAB — CBC
HCT: 39.4 % (ref 36.0–46.0)
HEMOGLOBIN: 13.2 g/dL (ref 12.0–15.0)
MCH: 28.5 pg (ref 26.0–34.0)
MCHC: 33.5 g/dL (ref 30.0–36.0)
MCV: 85.1 fL (ref 78.0–100.0)
PLATELETS: 334 10*3/uL (ref 150–400)
RBC: 4.63 MIL/uL (ref 3.87–5.11)
RDW: 13.6 % (ref 11.5–15.5)
WBC: 6.9 10*3/uL (ref 4.0–10.5)

## 2017-04-09 LAB — POCT PREGNANCY, URINE: PREG TEST UR: NEGATIVE

## 2017-04-09 MED ORDER — MEGESTROL ACETATE 40 MG PO TABS: 40.0000 mg | ORAL_TABLET | Freq: Two times a day (BID) | ORAL | 5 refills | Status: DC

## 2017-04-09 NOTE — Discharge Instructions (Signed)

## 2017-04-09 NOTE — MAU Provider Note (Signed)
Chief Complaint:  Vaginal Bleeding; Abdominal Pain; and Headache   First Provider Initiated Contact with Patient 04/09/17 1919       HPI: Lori [name] is a 26 y.o. [id number] who presents to maternity admissions reporting vaginal bleeding.  Had an IUD placed for bleeding issues but it caused her pain for 2 years so had it removed.  Since then has been having heavy bleeding with clots and cramps.. She reports vaginal bleeding, but no vaginal itching/burning, urinary symptoms, h/a, dizziness, n/v, or fever/chills.    Vaginal Bleeding  The patient's primary symptoms include pelvic pain and vaginal bleeding. The patient's pertinent negatives include no genital itching, genital lesions or genital odor. This is a recurrent problem. The current episode started in the past 7 days. The problem occurs constantly. The problem has been unchanged. She is not pregnant. Associated symptoms include abdominal pain and headaches. The vaginal discharge was bloody. The vaginal bleeding is heavier than menses. She has been passing clots. She has not been passing tissue. Nothing aggravates the symptoms. She has tried nothing for the symptoms.  Abdominal Pain  This is a recurrent problem. The current episode started in the past 7 days. The onset quality is gradual. The problem has been unchanged. The pain is mild. The quality of the pain is cramping. The abdominal pain does not radiate. Associated symptoms include headaches. Nothing aggravates the pain. The pain is relieved by nothing. She has tried nothing for the symptoms.  Headache   This is a recurrent problem. The problem occurs intermittently. The problem has been resolved. The patient is experiencing no pain. Associated symptoms include abdominal pain.    RN Note: Has had a tubal ligation 2014. Was having bleeding issues, has IUD placed to help control bleeding. Went to GYN on 10/17, IUD removed that day because of pain.  From THurs to today,  Started  bleeding, changing every 1-2 hrs, passing dime sized and bigger clots.  Feels drained.  Having headaches, off and on- not currently.  Called on call phys yesterday.  Talked to them today, was told to come in and have labs checked, bleeding evaluated.  Past Medical History: Past Medical History:  Diagnosis Date  . Abnormal uterine bleeding   . Migraine with aura     Past obstetric history: OB History  Gravida Para Term Preterm AB Living  3 2 2   1 2   SAB TAB Ectopic Multiple Live Births  1       2    # Outcome Date GA Lbr Len/2nd Weight Sex Delivery Anes PTL Lv  3 Term      Vag-Spont   LIV  2 SAB           1 Term      Vag-Spont   LIV      Past Surgical History: Past Surgical History:  Procedure Laterality Date  . DILATION AND CURETTAGE, DIAGNOSTIC / THERAPEUTIC  2015  . TUBAL LIGATION  2014    Family History: Family History  Problem Relation Age of Onset  . Diabetes Mother   . Heart disease Mother   . Diabetes Father   . Kidney disease Father   . Asthma Sister   . Seizures Sister   . Asthma Daughter   . Von Willebrand disease Son   . Asthma Sister   . Diabetes Maternal Grandmother   . Heart disease Maternal Grandmother   . Diabetes Maternal Grandfather   . Diabetes Paternal Grandmother   . Lupus  Paternal Grandmother   . Diabetes Paternal Grandfather     Social History: Social History  Substance Use Topics  . Smoking status: Never Smoker  . Smokeless tobacco: Never Used  . Alcohol use Yes     Comment: social    Allergies:  Allergies  Allergen Reactions  . Benylin Adult Formula [Dextromethorphan] Shortness Of Breath  . Benadryl [Diphenhydramine Hcl] Hives  . Codeine Itching  . Tdap [Diphth-Acell Pertussis-Tetanus]   . Tramadol Itching    Meds:  Prescriptions Prior to Admission  Medication Sig Dispense Refill Last Dose  . levonorgestrel (MIRENA) 20 MCG/24HR IUD 1 each by Intrauterine route once.   Taking  . metroNIDAZOLE (FLAGYL) 500 MG tablet Take  1 tablet (500 mg total) by mouth 2 (two) times daily. 14 tablet 0   . naproxen (NAPROSYN) 500 MG tablet Take 1 tablet (500 mg total) by mouth 2 (two) times daily. 30 tablet 0 Taking  . rizatriptan (MAXALT) 10 MG tablet Take 10 mg by mouth as needed for migraine. May repeat in 2 hours if needed   Taking    I have reviewed patient's Past Medical Hx, Surgical Hx, Family Hx, Social Hx, medications and allergies.  ROS:  Review of Systems  Gastrointestinal: Positive for abdominal pain.  Genitourinary: Positive for pelvic pain and vaginal bleeding.  Neurological: Positive for headaches.   Other systems negative     Physical Exam  Patient Vitals for the past 24 hrs:  BP Temp Temp src Pulse Resp SpO2 Weight  04/09/17 1753 128/81 98.3 F (36.8 C) Oral 91 18 100 % 253 lb 4 oz (114.9 kg)   Constitutional: Well-developed, well-nourished female in no acute distress.  Cardiovascular: normal rate and rhythm, no ectopy audible, S1 & S2 heard, no murmur Respiratory: normal effort, no distress. Lungs CTAB with no wheezes or crackles GI: Abd soft, non-tender.  Nondistended.  No rebound, No guarding.  Bowel Sounds audible  MS: Extremities nontender, no edema, normal ROM Neurologic: Alert and oriented x 4.   Grossly nonfocal. GU: Neg CVAT. Skin:  Warm and Dry Psych:  Affect appropriate.  PELVIC EXAM: Cervix pink, visually closed, without lesion, small to moderate bloody discharge, vaginal walls and external genitalia normal Bimanual exam: Cervix firm, anterior, neg CMT, uterus nontender, nonenlarged, adnexa without tenderness, enlargement, or mass    Labs: Results for orders placed or performed during the hospital encounter of 04/09/17 (from the past 24 hour(s))  Pregnancy, urine POC     Status: None   Collection Time: 04/09/17  6:21 PM  Result Value Ref Range   Preg Test, Ur NEGATIVE NEGATIVE  CBC     Status: None   Collection Time: 04/09/17  7:28 PM  Result Value Ref Range   WBC 6.9 4.0 -  10.5 K/uL   RBC 4.63 3.87 - 5.11 MIL/uL   Hemoglobin 13.2 12.0 - 15.0 g/dL   HCT 16.1 09.6 - 04.5 %   MCV 85.1 78.0 - 100.0 fL   MCH 28.5 26.0 - 34.0 pg   MCHC 33.5 30.0 - 36.0 g/dL   RDW 40.9 81.1 - 91.4 %   Platelets 334 150 - 400 K/uL    Imaging:    MAU Course/MDM: I have ordered labs as follows: CBC, UPT.  Normal HGB,  Neg UPT Imaging ordered: none Results reviewed.   Consult Dr Audie Box with presentation, results and exam findings.   Treatments in MAU included none.   Pt stable at time of discharge.  Assessment: Abnormal uterine bleeding  Plan: Discharge home Recommend followup in office Rx sent for Megace for AUB   Encouraged to return here or to other Urgent Care/ED if she develops worsening of symptoms, increase in pain, fever, or other concerning symptoms.   Wynelle BourgeoisMarie Tranell Wojtkiewicz CNM, MSN Certified Nurse-Midwife 04/09/2017 7:31 PM

## 2017-04-09 NOTE — MAU Note (Addendum)
Has had a tubal ligation 2014. Was having bleeding issues, has IUD placed to help control bleeding. Went to GYN on 10/17, IUD removed that day because of pain.  From THurs to today,  Started bleeding, changing every 1-2 hrs, passing dime sized and bigger clots.  Feels drained.  Having headaches, off and on- not currently.  Called on call phys yesterday.  Talked to them today, was told to come in and have labs checked, bleeding evaluated.

## 2017-04-09 NOTE — Telephone Encounter (Signed)
Called to patient. She is still bleeding heavy and passing clots. She is saturating 1-2  pads with-in an hour. Patient states she spoke with Dr. Edward JollySilva yesterday and Dr. Edward JollySilva advised patient to go to MAU last night. Patient states she couldn't miss work so she did not go. She is going to go to MAU when her aunt gets off work at 4 pm. She will call us back tomorrow for a follow up-eh

## 2017-04-09 NOTE — Telephone Encounter (Signed)
Please call and check on the patient. She had her IUD removed last week and called the on call MD yesterday with heavy bleeding.

## 2017-04-10 MED FILL — MEGESTROL 40 MG TABLET: 40 | 30 days supply | Qty: 60 | Fill #0

## 2017-04-10 NOTE — Telephone Encounter (Signed)
Patient wants to speak with the nurse. She has questions regarding a medication.

## 2017-04-10 NOTE — Telephone Encounter (Signed)
Spoke with patient. States she was [facility], was advised to start Megace, medication was not sent to correct pharmacy. Patient request Meg[facility] to [facility] to [facility]. Advised patient she may call [facility]. Reports bleeding is unchanged, changing saturated pad q1 hours with clotting. Reports fatigue; denies SHOB, weakness, lightheadedness. Pharmacy updated.   Advised patient to call to transfer RX, will review with Dr. Oscar LaJertson and return call, patient verbalizes understanding and is agreeable.   Reviewed with Dr. Oscar LaJertson, have patient start Megace as recommended, schedule f/u for next week.    Call returned to patient. [facility], was able to [facility]. Advised as seen above per Dr. Oscar LaJertson. Scheduled for OV on 10/29 at 2:30pm. Advised to return call to office for any additional questions/concerns. ER precautions reviewed. Patient verbalizes understanding and si agreeable.   Routing to provider for final review. Patient is agreeable to disposition. Will close encounter.

## 2017-04-12 ENCOUNTER — Telehealth: Payer: Self-pay

## 2017-04-12 NOTE — Telephone Encounter (Signed)
Left message to call Shaquinta Peruski at [phone]. 

## 2017-04-12 NOTE — Telephone Encounter (Signed)
Spoke with patient. Patient states that she is changing a large pad every hour due to it being full front to back. States since starting the Megace she has developed a headache. Had to remove contacts and feels her vision is blurry. Advised will need to review with MD regarding switching from Megace to Aygestin. Advised unsure how much this will cost with insurance. Patient will check with her pharmacy and contact the office to let Dr.Jertson know if this is reasonably priced after being sent in if needed. Aware will need to get bleeding stopped before scheduling colposcopy. Patient is agreeable.

## 2017-04-12 NOTE — Telephone Encounter (Signed)
Spoke with patient. Advised of results as seen below from Dr.Jertson. Patient verbalizes understanding. Patient reports her IUD was removed in office on 04/04/2017. On 04/09/2017 patient was seen [facility] for [facility]. Was started on Megace 40 mg bid until bleeding stops then decrease to one per day. States she is still taking 2 per day. Bleeding has slightly decreased. Has follow up on 04/16/2017 with Dr.Jertson. Advised will review with Dr.Jertson regarding recommendations for scheduling and return call.

## 2017-04-12 NOTE — Telephone Encounter (Signed)
Patient returning call.

## 2017-04-12 NOTE — Telephone Encounter (Signed)
-----   Message from Romualdo BolkJill Evelyn Jertson, MD sent at 04/11/2017  4:08 PM EDT ----- Please inform the patient ASCUS, +HPV pap and set her up for a colposcopy

## 2017-04-12 NOTE — Telephone Encounter (Signed)
Spoke with patient after review with Dr.Jertson. Dr.Jertson recommends that the [facility] be [facility] for evaluation of ongoing heavy bleeding with taking Megace, headaches, and vision changes. Patient is asking if she can wait to be seen tomorrow. Advised will need to be seen today for evaluation and will need to head to the hospital at this time. Patient verbalizes understanding.

## 2017-04-13 NOTE — Telephone Encounter (Signed)
Please check on her

## 2017-04-13 NOTE — Telephone Encounter (Signed)
Attempted to reach patient at number provided, [phone] there was no answer and the mailbox is full.

## 2017-04-16 ENCOUNTER — Encounter: Payer: Self-pay | Admitting: Obstetrics and Gynecology

## 2017-04-16 ENCOUNTER — Ambulatory Visit (INDEPENDENT_AMBULATORY_CARE_PROVIDER_SITE_OTHER): Payer: 59 | Admitting: Obstetrics and Gynecology

## 2017-04-16 VITALS — BP 112/72 | HR 92 | Resp 18 | Wt 257.0 lb

## 2017-04-16 DIAGNOSIS — N939 Abnormal uterine and vaginal bleeding, unspecified: Secondary | ICD-10-CM

## 2017-04-16 NOTE — Telephone Encounter (Signed)
Patient here for follow up appointment- Patient states bleeding has stopped. See OV notes -eh

## 2017-04-16 NOTE — Progress Notes (Signed)
GYNECOLOGY  VISIT   HPI: 26 y.o.   Single  African American  female   [phone] with Patient's last menstrual period was 04/04/2017.   here for follow up on AUB. Patient bled for 10 days straight after having Mirena ID removed on 04/04/17. IUD was removed for pain,  Nothing but spotting with the IUD in place. The IUD was originally placed after a D&C in 5/16 which was done for abnormal bleeding.  She had a normal pelvic CT in 7/18 for another reason.  She was started on Megace on 04/09/17 when seen at [facility] for heavy vaginal bleeding. The megace was giving her headaches so she stopped it on Thursday, causing blurry vision. Bleeding stopped on Saturday. Headaches are on and off, no visual changes. When she was bleeding, she was going through 1-2 pads an hour (then reports going through 48 pads in 6-7 days).  She was only having spotting with the mirena. She has had a recent normal TSH and negative UPT. Currently feeling fine. She was drained and sleeping a lot when she was bleeding. She was cramping with the bleeding as well.   She has an appointment with Dr April MansonYalcinkaya in January to discuss tubal reversal.   GYNECOLOGIC HISTORY: Patient's last menstrual period was 04/04/2017. Contraception:tunal Ligation  Menopausal hormone therapy: none         OB History    Gravida Para Term Preterm AB Living   3 2 2   1 2    SAB TAB Ectopic Multiple Live Births   1       2         Patient Active Problem List   Diagnosis Date Noted  . Migraine with aura     Past Medical History:  Diagnosis Date  . Abnormal uterine bleeding   . Migraine with aura     Past Surgical History:  Procedure Laterality Date  . DILATION AND CURETTAGE, DIAGNOSTIC / THERAPEUTIC  2015  . TUBAL LIGATION  2014    Current Outpatient Prescriptions  Medication Sig Dispense Refill  . megestrol (MEGACE) 40 MG tablet Take 1 tablet (40 mg total) by mouth 2 (two) times daily. Until bleeding slows then one per day 60 tablet 5  .  naproxen (NAPROSYN) 500 MG tablet Take 1 tablet (500 mg total) by mouth 2 (two) times daily. 30 tablet 0  . rizatriptan (MAXALT) 10 MG tablet Take 10 mg by mouth as needed for migraine. May repeat in 2 hours if needed     No current facility-administered medications for this visit.      ALLERGIES: Benylin adult formula [dextromethorphan]; Benadryl [diphenhydramine hcl]; Codeine; Tdap [diphth-acell pertussis-tetanus]; and Tramadol  Family History  Problem Relation Age of Onset  . Diabetes Mother   . Heart disease Mother   . Diabetes Father   . Kidney disease Father   . Asthma Sister   . Seizures Sister   . Asthma Daughter   . Von Willebrand disease Son   . Asthma Sister   . Diabetes Maternal Grandmother   . Heart disease Maternal Grandmother   . Diabetes Maternal Grandfather   . Diabetes Paternal Grandmother   . Lupus Paternal Grandmother   . Diabetes Paternal Grandfather     Social History   Social History  . Marital status: Single    Spouse name: N/A  . Number of children: N/A  . Years of education: N/A   Occupational History  . Not on file.   Social History Main Topics  .  Smoking status: Never Smoker  . Smokeless tobacco: Never Used  . Alcohol use Yes     Comment: social  . Drug use: No  . Sexual activity: Yes    Partners: Male    Birth control/ protection: Surgical   Other Topics Concern  . Not on file   Social History Narrative   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   Two children ( daughter 36 , son 3)           Review of Systems  Constitutional: Negative.   HENT: Negative.   Eyes: Negative.   Respiratory: Negative.   Cardiovascular: Negative.   Gastrointestinal: Negative.   Genitourinary: Negative.   Musculoskeletal: Negative.   Skin: Negative.   Neurological: Negative.   Endo/Heme/Allergies: Negative.   Psychiatric/Behavioral: Negative.     PHYSICAL EXAMINATION:    BP 112/72 (BP Location: Right Arm, Patient Position: Sitting, Cuff  Size: Normal)   Pulse 92   Resp 18   Wt 257 lb (116.6 kg)   LMP 04/04/2017   BMI 42.77 kg/m     General appearance: alert, cooperative and appears stated age Abdomen: soft, non-tender; non distended, no masses,  no organomegaly   ASSESSMENT Episode of very heavy bleeding for 10 days after having the mirena IUD removed. Normal TSH, normal CBC at the beginning of the bleeding, negative UPT Desires tubal reversal, has a consultation set up    PLAN CBC Return for gyn ultrasound   An After Visit Summary was printed and given to the patient.  ~15 minutes face to face time of which over 50% was spent in counseling.

## 2017-04-17 LAB — CBC
HEMATOCRIT: 38.8 % (ref 34.0–46.6)
HEMOGLOBIN: 12.6 g/dL (ref 11.1–15.9)
MCH: 27.8 pg (ref 26.6–33.0)
MCHC: 32.5 g/dL (ref 31.5–35.7)
MCV: 86 fL (ref 79–97)
Platelets: 333 10*3/uL (ref 150–379)
RBC: 4.54 x10E6/uL (ref 3.77–5.28)
RDW: 13.5 % (ref 12.3–15.4)
WBC: 8.1 10*3/uL (ref 3.4–10.8)

## 2017-05-08 ENCOUNTER — Encounter: Payer: Self-pay | Admitting: Obstetrics and Gynecology

## 2017-05-08 ENCOUNTER — Ambulatory Visit (INDEPENDENT_AMBULATORY_CARE_PROVIDER_SITE_OTHER): Payer: 59

## 2017-05-08 ENCOUNTER — Ambulatory Visit (INDEPENDENT_AMBULATORY_CARE_PROVIDER_SITE_OTHER): Payer: 59 | Admitting: Obstetrics and Gynecology

## 2017-05-08 ENCOUNTER — Other Ambulatory Visit: Payer: Self-pay

## 2017-05-08 VITALS — BP 124/78 | HR 92 | Resp 16 | Wt 258.0 lb

## 2017-05-08 DIAGNOSIS — N939 Abnormal uterine and vaginal bleeding, unspecified: Secondary | ICD-10-CM | POA: Diagnosis not present

## 2017-05-08 DIAGNOSIS — R87610 Atypical squamous cells of undetermined significance on cytologic smear of cervix (ASC-US): Secondary | ICD-10-CM | POA: Diagnosis not present

## 2017-05-08 NOTE — Progress Notes (Signed)
GYNECOLOGY  VISIT   HPI: 26 y.o.   Single  African American  female   [phone] with Patient's last menstrual period was 04/07/2017.   here for follow up abnormal uterine bleeding. The patient had very abnormal uterine bleeding after removal of a mirena IUD (removed secondary to pain). The IUD was originally placed in 2016 for cycle control.  She has had a tubal ligation and has an appointment to see Dr April MansonYalcinkaya in 1/19 to discuss tubal reversal. As part of her work up for the abnormal bleeding she had a negative UPT, negative GC/CT, normal CBC and TSH. She also had an ASCUS, +HPV pap and is due for a colposcopy. She hasn't had any bleeding since last month. Feeling okay. She has been getting migraines, swelling in her feet and nausea.    GYNECOLOGIC HISTORY: Patient's last menstrual period was 04/07/2017. Contraception:Tubal Ligation  Menopausal hormone therapy: none         OB History    Gravida Para Term Preterm AB Living   3 2 2   1 2    SAB TAB Ectopic Multiple Live Births   1       2         Patient Active Problem List   Diagnosis Date Noted  . Migraine with aura     Past Medical History:  Diagnosis Date  . Abnormal uterine bleeding   . Migraine with aura     Past Surgical History:  Procedure Laterality Date  . DILATION AND CURETTAGE, DIAGNOSTIC / THERAPEUTIC  2015  . TUBAL LIGATION  2014    Current Outpatient Medications  Medication Sig Dispense Refill  . rizatriptan (MAXALT) 10 MG tablet Take 10 mg by mouth as needed for migraine. May repeat in 2 hours if needed     No current facility-administered medications for this visit.      ALLERGIES: Benylin adult formula [dextromethorphan]; Benadryl [diphenhydramine hcl]; Codeine; Tdap [diphth-acell pertussis-tetanus]; and Tramadol  Family History  Problem Relation Age of Onset  . Diabetes Mother   . Heart disease Mother   . Diabetes Father   . Kidney disease Father   . Asthma Sister   . Seizures Sister   .  Asthma Daughter   . Von Willebrand disease Son   . Asthma Sister   . Diabetes Maternal Grandmother   . Heart disease Maternal Grandmother   . Diabetes Maternal Grandfather   . Diabetes Paternal Grandmother   . Lupus Paternal Grandmother   . Diabetes Paternal Grandfather     Social History   Socioeconomic History  . Marital status: Single    Spouse name: Not on file  . Number of children: Not on file  . Years of education: Not on file  . Highest education level: Not on file  Social Needs  . Financial resource strain: Not on file  . Food insecurity - worry: Not on file  . Food insecurity - inability: Not on file  . Transportation needs - medical: Not on file  . Transportation needs - non-medical: Not on file  Occupational History  . Not on file  Tobacco Use  . Smoking status: Never Smoker  . Smokeless tobacco: Never Used  Substance and Sexual Activity  . Alcohol use: Yes    Comment: social  . Drug use: No  . Sexual activity: Yes    Partners: Male    Birth control/protection: Surgical  Other Topics Concern  . Not on file  Social History Narrative   She  is a Agricultural engineernursing assistant    Not married   Two children ( daughter 488 , son 3)        Review of Systems  Constitutional: Negative.   HENT: Negative.   Eyes: Negative.   Respiratory: Negative.   Cardiovascular: Negative.   Gastrointestinal: Negative.   Genitourinary: Negative.   Musculoskeletal: Negative.   Skin: Negative.   Neurological: Negative.   Endo/Heme/Allergies: Negative.   Psychiatric/Behavioral: Negative.     PHYSICAL EXAMINATION:    BP 124/78 (BP Location: Right Arm, Patient Position: Sitting, Cuff Size: Normal)   Pulse 92   Resp 16   Wt 258 lb (117 kg)   LMP 04/07/2017   BMI 42.93 kg/m     General appearance: alert, cooperative and appears stated age  Ultrasound images reviewed with the patient.   ASSESSMENT Abnormal uterine bleeding after removal of her IUD, normal ultrasound and  labs H/O tubal ligation, wants reversal, has appointment with Dr April MansonYalcinkaya  H/O ASCUS/+HPV pap    PLAN Calendar bleeding F/U for colposcopy    An After Visit Summary was printed and given to the patient.  10 minutes face to face time of which over 50% was spent in counseling.

## 2017-05-08 NOTE — Patient Instructions (Signed)
Colposcopy  Colposcopy is a procedure to examine the lowest part of the uterus (cervix), the vagina, and the area around the vaginal opening (vulva) for abnormalities or signs of disease. The procedure is done using a lighted microscope or magnifying lens (colposcope). If any unusual cells are found during the procedure, your health care provider may remove a tissue sample for testing (biopsy). A colposcopy may be done if you:   Have an abnormal Pap test. A Pap test is a screening test that is used to check for signs of cancer or infection of the vagina, cervix, and uterus.   Have a Pap smear test in which you test positive for high-risk HPV (human papillomavirus).   Have a sore or lesion on your cervix.   Have genital warts on your vulva, vagina, or cervix.   Took certain medicines while pregnant, such as diethylstilbestrol (DES).   Have pain during sexual intercourse.   Have vaginal bleeding, especially after sexual intercourse.   Need to have a cervical polyp removed.   Need to have a lost intrauterine device (IUD) string located.    Let your health care provider know about:   Any allergies you have, including allergies to prescribed medicine, latex, or iodine.   All medicines you are taking, including vitamins, herbs, eye drops, creams, and over-the-counter medicines. Bring a list of all of your medicines to your appointment.   Any problems you or family members have had with anesthetic medicines.   Any blood disorders you have.   Any surgeries you have had.   Any medical conditions you have, such as pelvic inflammatory disease (PID) or endometrial disorder.   Any history of frequent fainting.   Your menstrual cycle and what form of birth control (contraception) you use.   Your medical history, including any prior cervical treatment.   Whether you are pregnant or may be pregnant.  What are the risks?  Generally, this is a safe procedure. However, problems may occur,  including:   Pain.   Infection, which may include a fever, bad-smelling discharge, or pelvic pain.   Bleeding or discharge.   Misdiagnosis.   Fainting and vasovagal reactions, but this is rare.   Allergic reactions to medicines.   Damage to other structures or organs.    What happens before the procedure?   If you have your menstrual period or will have it at the time of your procedure, tell your health care provider. A colposcopy typically is not done during menstruation.   Continue your contraceptive practices before and after the procedure.   For 24 hours before the colposcopy:  ? Do not douche.  ? Do not use tampons.  ? Do not use medicines, creams, or suppositories in the vagina.  ? Do not have sexual intercourse.   Ask your health care provider about:  ? Changing or stopping your regular medicines. This is especially important if you are taking diabetes medicines or blood thinners.  ? Taking medicines such as aspirin and ibuprofen. These medicines can thin your blood. Do not take these medicines before your procedure if your health care provider instructs you not to. It is likely that your health care provider will tell you to avoid taking aspirin or medicine that contains aspirin for 7 days before the procedure.   Follow instructions from your health care provider about eating or drinking restrictions. You will likely need to eat a regular diet the day of the procedure and not skip any meals.   You   may have an exam or testing. A pregnancy test will be taken on the day of the procedure.   You may have a blood or urine sample taken.   Plan to have someone take you home from the hospital or clinic.   If you will be going home right after the procedure, plan to have someone with you for 24 hours.  What happens during the procedure?   You will lie down on your back, with your feet in foot rests (stirrups).   A warmed and lubricated instrument (speculum) will be inserted into your vagina. The  speculum will be used to hold apart the walls of your vagina so your health care provider can see your cervix and the inside of your vagina.   A cotton swab will be used to place a small amount of liquid solution on the areas to be examined. This solution makes it easier to see abnormal cells. You may feel a slight burning during this part.   The colposcope will be used to scan the cervix with a bright white light. The colposcope will be held near your vulvaand will magnify your vulva, vagina, and cervix for easier examination.   Your health care provider may decide to take a biopsy. If so:  ? You may be given medicine to numb the area (local anesthetic).  ? Surgical instruments will be used to suck out mucus and cells through your vagina.  ? You may feel mild pain while the tissue sample is removed.  ? Bleeding may occur. A solution may be used to stop the bleeding.  ? If a sample of tissue is needed from the inside of the cervix, a different procedure called endocervical curettage (ECC) may be completed. During this procedure, a curved instrument (curette) will be used to scrape cells from your cervix or the top of your cervix (endocervix).   Your health care provider will record the location of any abnormalities.  The procedure may vary among health care providers and hospitals.  What happens after the procedure?   You will lie down and rest for a few minutes. You may be offered juice or cookies.   Your blood pressure, heart rate, breathing rate, and blood oxygen level will be monitored until any medicines you were given have worn off.   You may have to wear compression stockings. These stockings help to prevent blood clots and reduce swelling in your legs.   You may have some cramping in your abdomen. This should go away after a few minutes.  This information is not intended to replace advice given to you by your health care provider. Make sure you discuss any questions you have with your health care  provider.  Document Released: 08/26/2002 Document Revised: 02/01/2016 Document Reviewed: 01/10/2016  Elsevier Interactive Patient Education  2018 [employer]

## 2017-05-08 NOTE — Progress Notes (Signed)
Patient scheduled for colposcopy while in office. Contraception -tubal ligation. LMP 04/07/17.  Scheduled for 05/28/17 at 9am with Dr. Oscar LaJertson. Patient declined earlier appt offered. Advised to take Motrin 800 mg with food and water one hour before procedure.

## 2017-05-23 ENCOUNTER — Other Ambulatory Visit: Payer: Self-pay | Admitting: *Deleted

## 2017-05-23 DIAGNOSIS — R87610 Atypical squamous cells of undetermined significance on cytologic smear of cervix (ASC-US): Secondary | ICD-10-CM

## 2017-05-23 DIAGNOSIS — R87810 Cervical high risk human papillomavirus (HPV) DNA test positive: Principal | ICD-10-CM

## 2017-05-27 ENCOUNTER — Telehealth: Payer: Self-pay | Admitting: Obstetrics and Gynecology
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
## 2017-05-28 ENCOUNTER — Ambulatory Visit: Payer: Self-pay | Admitting: Obstetrics and Gynecology
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
## 2017-06-15 NOTE — Telephone Encounter (Signed)
Attempted to reach patient at number provided [phone], there was no answer and voicemail box is full. Need to reschedule patient's colposcopy. Patient has BTL.

## 2017-07-02 DIAGNOSIS — Z310 Encounter for reversal of previous sterilization: Secondary | ICD-10-CM | POA: Diagnosis not present

## 2017-07-04 NOTE — Telephone Encounter (Signed)
Left message to call Antonae Zbikowski at [phone]. 

## 2017-07-17 NOTE — Telephone Encounter (Signed)
Reschedule colposcopy 07/17/2017 12:51 PM    To: Etheleen SiaAndrea Danay [name]    From: Ginny ForthSprague, [name] E, RN    Created: 07/17/2017 12:51 PM     Lori LushAndrea,  Our office has attempted to reach you via telephone to assist with rescheduling your colposcopy appointment with Dr.Jertson. At this time we have not received a return call to get you scheduled. Please contact the office as soon as possible to schedule this important follow up.  Sincerely,  Nolen MuKaitlyn Erina Hamme, RN

## 2017-07-25 NOTE — Telephone Encounter (Signed)
Dr. Jertson, please advise. 

## 2017-07-25 NOTE — Telephone Encounter (Signed)
Please send a certified letter and close the encounter.

## 2017-07-26 ENCOUNTER — Telehealth: Payer: Self-pay | Admitting: Obstetrics and Gynecology

## 2017-07-26 NOTE — Telephone Encounter (Signed)
Spoke with patient, advised as seen below per Dr. Oscar LaJertson. OV scheduled for 07/27/17 at 4:15pm with Dr. Oscar LaJertson. OV cancelled for 2/14. Patient verbalizes understanding and is agreeable.   Routing to provider for final review. Patient is agreeable to disposition. Will close encounter.

## 2017-07-26 NOTE — Telephone Encounter (Signed)
Patient says she has been bleeding heavy and passing clots for a month.

## 2017-07-26 NOTE — Telephone Encounter (Signed)
Spoke with patient. Patient reports heavy bleeding and clots for 1 month with increased migraines. Reports lightheadedness and weakness with migraines. Has been taking Excedrin for migraine relief.   Wears pad and super plus tampon, changes tampon q3 hrs.   Denies pain, SHOB, N/V, fever/chills.   Recommended OV today with Dr. Oscar LaJertson, patient declined. Patient declined multiple OV offered, states can only come after 3:30pm or on 2/14. OV scheduled for 2/14 at 11:30am.  Advised patient did not recommend waiting until 2/14 for evaluation, again patient declined. ER precautions reviewed with patient. Patient asking what to do about migraines? Recommended OV for further evaluation of bleeding, patient can also f/u with PCP for migraines. Advised will review with Dr. Oscar LaJertson and return call with any additional recommendations.   Dr. Oscar LaJertson -please review.

## 2017-07-26 NOTE — Telephone Encounter (Signed)
Reviewed with Dr. Oscar LaJertson, recommended OV for further evaluation of bleeding. May schedule 2/8 at 4:15pm. Recommended f/u with PCP for migraines.   Call returned to patient, no answer, Left message to call Noreene LarssonJill at [phone].

## 2017-07-27 ENCOUNTER — Encounter: Payer: Self-pay | Admitting: Obstetrics and Gynecology

## 2017-07-27 ENCOUNTER — Ambulatory Visit: Payer: Self-pay | Admitting: Obstetrics and Gynecology

## 2017-07-27 ENCOUNTER — Ambulatory Visit (INDEPENDENT_AMBULATORY_CARE_PROVIDER_SITE_OTHER): Payer: Self-pay | Admitting: Obstetrics and Gynecology

## 2017-07-27 VITALS — BP 108/64 | HR 80 | Wt 262.8 lb

## 2017-07-27 DIAGNOSIS — Z8742 Personal history of other diseases of the female genital tract: Secondary | ICD-10-CM

## 2017-07-27 DIAGNOSIS — Z87898 Personal history of other specified conditions: Secondary | ICD-10-CM

## 2017-07-27 DIAGNOSIS — N921 Excessive and frequent menstruation with irregular cycle: Secondary | ICD-10-CM

## 2017-07-27 LAB — POCT URINE PREGNANCY: Preg Test, Ur: NEGATIVE

## 2017-07-27 MED ORDER — MEDROXYPROGESTERONE ACETATE 10 MG PO TABS: ORAL_TABLET | ORAL | 0 refills | Status: DC

## 2017-07-27 NOTE — Patient Instructions (Signed)

## 2017-07-27 NOTE — Progress Notes (Signed)
Patient scheduled while in office for colposcopy with Dr. Oscar LaJertson on 08/02/17 at 11:30am. Advised to take Motrin 800 mg with food and water one hour before procedure. Patient verbalizes understanding and is agreeable.

## 2017-07-27 NOTE — Progress Notes (Signed)
GYNECOLOGY  VISIT   HPI: 27 y.o.   Single  African American  female   [phone] with Patient's last menstrual period was 07/03/2017 (exact date).   here for heavy menstrual bleeding since 07-03-17.  The patient has a long h/o AUB. She had a D&C in 5/16 for AUB and then had an IUD placed. The IUD controlled her bleeding, but she had severe cramps, she didn't have the cramps prior to the IUD. She had the IUD pulled in 10/18. She had very heavy bleeding for 10 days after having the IUD removed (normal TSH, negative genprobe, normal CBC and negative UPT). Normal ultrasound in 11/18.  She had a cycle in November for one week, saturating a super tampon in 2 hours. Next Cycle was a month later around 12/14 x 7 days. Then cycle started when it was supposed to on 07/02/17 and is still bleeding. Very heavy, passing clots. Saturating a super tampon every hour. Having cramps intermittently. She c/o fatigue, sometimes lightheaded.  Same long term partner. No worries about STD's She is overdue for a colposcopy for an ASCUS,+HPV pap.  She is interested in tubal reversal and has seen Dr April MansonYalcinkaya for a consultation. They are talking about surgery.  She has a h/o migraines with aura. Headaches have worsened with her bleeding.  Mother had open heart surgery on 07/04/17.   GYNECOLOGIC HISTORY: Patient's last menstrual period was 07/03/2017 (exact date). Contraception: tubal Menopausal hormone therapy: none        OB History    Gravida Para Term Preterm AB Living   3 2 2   1 2    SAB TAB Ectopic Multiple Live Births   1       2         Patient Active Problem List   Diagnosis Date Noted  . Migraine with aura     Past Medical History:  Diagnosis Date  . Abnormal uterine bleeding   . Migraine with aura     Past Surgical History:  Procedure Laterality Date  . DILATION AND CURETTAGE, DIAGNOSTIC / THERAPEUTIC  2015  . TUBAL LIGATION  2014    Current Outpatient Medications  Medication Sig Dispense  Refill  . aspirin-acetaminophen-caffeine (EXCEDRIN MIGRAINE) 250-250-65 MG tablet Take by mouth every 6 (six) hours as needed for headache.    . rizatriptan (MAXALT) 10 MG tablet Take 10 mg by mouth as needed for migraine. May repeat in 2 hours if needed     No current facility-administered medications for this visit.      ALLERGIES: Benylin adult formula [dextromethorphan]; Benadryl [diphenhydramine hcl]; Codeine; Tdap [diphth-acell pertussis-tetanus]; and Tramadol  Family History  Problem Relation Age of Onset  . Diabetes Mother   . Heart disease Mother   . Diabetes Father   . Kidney disease Father   . Asthma Sister   . Seizures Sister   . Asthma Daughter   . Von Willebrand disease Son   . Asthma Sister   . Diabetes Maternal Grandmother   . Heart disease Maternal Grandmother   . Diabetes Maternal Grandfather   . Diabetes Paternal Grandmother   . Lupus Paternal Grandmother   . Diabetes Paternal Grandfather     Social History   Socioeconomic History  . Marital status: Single    Spouse name: Not on file  . Number of children: Not on file  . Years of education: Not on file  . Highest education level: Not on file  Social Needs  . Financial resource strain:  Not on file  . Food insecurity - worry: Not on file  . Food insecurity - inability: Not on file  . Transportation needs - medical: Not on file  . Transportation needs - non-medical: Not on file  Occupational History  . Not on file  Tobacco Use  . Smoking status: Never Smoker  . Smokeless tobacco: Never Used  Substance and Sexual Activity  . Alcohol use: Yes    Comment: social  . Drug use: No  . Sexual activity: Yes    Partners: Male    Birth control/protection: Surgical  Other Topics Concern  . Not on file  Social History Narrative   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   Two children ( daughter 42 , son 3)        ROS  PHYSICAL EXAMINATION:    BP 108/64 (BP Location: Right Arm, Patient Position:  Sitting, Cuff Size: Large)   Pulse 80   Wt 262 lb 12.8 oz (119.2 kg)   LMP 07/03/2017 (Exact Date)   BMI 43.73 kg/m     General appearance: alert, cooperative and appears stated age Neck: no adenopathy, supple, symmetrical, trachea midline and thyroid normal to inspection and palpation Abdomen: soft, non-tender; non distended, no masses,  no organomegaly  Pelvic: External genitalia:  no lesions              Urethra:  normal appearing urethra with no masses, tenderness or lesions              Bartholins and Skenes: normal                 Vagina: normal appearing vagina with normal color and discharge, no lesions              Cervix: no cervical motion tenderness and no lesions              Bimanual Exam:  Uterus:  normal size, contour, position, consistency, mobility, non-tender and anteverted              Adnexa: no mass, fullness, tenderness                The risks of endometrial biopsy were reviewed and a consent was obtained.  A speculum was placed in the vagina and the cervix was cleansed with betadine. A tenaculum was placed on the cervix and the uterine evacuator was placed into the endometrial cavity. The uterus sounded to 7 cm. The endometrial biopsy was performed, moderate tissue was obtained. The tenaculum and speculum were removed. There were no complications.   Chaperone was present for exam.  ASSESSMENT Menometrorrhagia, recent normal TSH, normal ultrasound, negative genprobe H/O abnormal pap, overdue for colposcopy H/O tubal ligation, planing reversal with Dr April Manson Migraine with aura, can't take OCP's, didn't do well with the mirena IUD    PLAN Negative UPT Office D&C  Office Hgb 11.9 CBC, ferritin Provera 10 mg BID until her bleeding slows down, then one tablet a day for 10 days Consider cyclic provera to try and control cycles  Return for colposcopy   An After Visit Summary was printed and given to the patient.

## 2017-07-27 NOTE — Addendum Note (Signed)
Addended by: Leda MinHAMM, Jacalynn Buzzell N on: 07/27/2017 05:00 PM   Modules accepted: Orders

## 2017-07-28 LAB — CBC
HEMOGLOBIN: 11.9 g/dL (ref 11.1–15.9)
Hematocrit: 35.9 % (ref 34.0–46.6)
MCH: 27.7 pg (ref 26.6–33.0)
MCHC: 33.1 g/dL (ref 31.5–35.7)
MCV: 84 fL (ref 79–97)
Platelets: 385 10*3/uL — ABNORMAL HIGH (ref 150–379)
RBC: 4.29 x10E6/uL (ref 3.77–5.28)
RDW: 13.6 % (ref 12.3–15.4)
WBC: 8.1 10*3/uL (ref 3.4–10.8)

## 2017-07-28 LAB — FERRITIN: FERRITIN: 113 ng/mL (ref 15–150)

## 2017-07-30 ENCOUNTER — Telehealth: Payer: Self-pay | Admitting: *Deleted

## 2017-07-30 NOTE — Telephone Encounter (Signed)
Spoke with patient and gave results. Patient states her bleeding is the same- She started the Provera 3 days again. She has bled through 5 super  tampons and 2 pads today. She is still scheduled 08-02-17 for colposcopy.  Sending to Dr. Oscar LaJertson for review -eh

## 2017-07-30 NOTE — Telephone Encounter (Signed)
Check if she is taking the provera 2 x a day, we can see her tomorrow if it isn't improving.

## 2017-07-30 NOTE — Telephone Encounter (Signed)
Left message to call for regarding results -eh

## 2017-07-30 NOTE — Telephone Encounter (Signed)
Patient returning call.

## 2017-07-30 NOTE — Telephone Encounter (Signed)
-----   Message from Romualdo BolkJill Evelyn Jertson, MD sent at 07/30/2017  3:23 PM EST ----- Please call and check on the patient, see if her bleeding has slowed down with the provera. Please let her know that she is not anemic and has good iron stores. The biopsy is still pending.

## 2017-07-30 NOTE — Telephone Encounter (Signed)
Spoke with patient and she is taking two Provera. She was advised to call the office in the morning if the bleeding does not slow down or [facility]. Patient voiced understanding -eh

## 2017-07-31 ENCOUNTER — Telehealth: Payer: Self-pay | Admitting: Obstetrics and Gynecology

## 2017-07-31 NOTE — Telephone Encounter (Signed)
Reviewed with Dr. Oscar LaJertson. Recommended OV for further evaluation of bleeding, can add to 2/13 afternoon schedule. Last Hgb WNL.   Call returned to patient. Patient states she is scheduled for colpo on 2/14, anything additional going to be done if I come in today or tomorrow? Patient denies SHOB, dizziness, fatigue, weakness. Advised OV recommended for further evaluation of bleeding by provider to determine plan of care. OV offered for 2/13 at 4;15pm, patient declined. Patient will keep OV for colposcopy on 2/14 with Dr. Oscar LaJertson. Advised should new symptoms develop or bleeding become heavy changing pad/tampon q1-2 hours, return call to office for OV or seek immediate care at local ER/[facility] if after hours.   Routing to provider for final review. Patient is agreeable to disposition. Will close encounter.

## 2017-07-31 NOTE — Telephone Encounter (Signed)
Patient says she was told to call back if she was still bleeding.

## 2017-07-31 NOTE — Telephone Encounter (Signed)
Spoke with patient, patient reports taking provera bid, bleeding has not improved. Has changed 3 super plus tampons this morning, wearing a pad for back up.   Recommended OV today for further evaluation. Patient declined 1:15 and 2:15pm appt. Patient states she can not leave any earlier from her job, gets off at 3:30pm. Patient states she has done everything recommended, requesting "next steps"? Asking about result of EMB, advised results have not returned.   Advised will review scheduling with Dr. Oscar LaJertson and return call, patient is agreeable.   Dr. Oscar LaJertson -please review and advise.

## 2017-07-31 NOTE — Telephone Encounter (Signed)
Patient was seen in office on 07/27/2017 with Dr.Jertson. Please see OV.

## 2017-08-01 ENCOUNTER — Telehealth: Payer: Self-pay | Admitting: Obstetrics and Gynecology

## 2017-08-01 NOTE — Telephone Encounter (Signed)
Patient returned call. She advises she is still trying to get her insurance "straightened out". Patient understands patient responsibility, but also understand we will re-certify benefits once valid insurance is presented.

## 2017-08-01 NOTE — Telephone Encounter (Signed)
Call placed to patient to review benefits for scheduled appointment. Left voicemail message requesting a return call °

## 2017-08-02 ENCOUNTER — Encounter: Payer: Self-pay | Admitting: Obstetrics and Gynecology

## 2017-08-02 ENCOUNTER — Ambulatory Visit: Payer: Self-pay | Admitting: Obstetrics and Gynecology

## 2017-08-02 NOTE — Telephone Encounter (Signed)
[facility]. I was unable to leave a message for patient to call and reschedule. Patient mailbox was full. [facility] and letter mailed to patient.

## 2017-10-24 ENCOUNTER — Encounter: Payer: Self-pay | Admitting: Obstetrics and Gynecology

## 2017-11-27 ENCOUNTER — Encounter: Payer: Self-pay | Admitting: Obstetrics and Gynecology

## 2018-02-14 MED FILL — MEGESTROL 40 MG TABLET: 40 | 30 days supply | Qty: 60 | Fill #1

## 2018-04-05 ENCOUNTER — Ambulatory Visit (INDEPENDENT_AMBULATORY_CARE_PROVIDER_SITE_OTHER): Payer: Self-pay | Admitting: Adult Health

## 2018-04-05 ENCOUNTER — Encounter: Payer: Self-pay | Admitting: Adult Health

## 2018-04-05 ENCOUNTER — Ambulatory Visit (INDEPENDENT_AMBULATORY_CARE_PROVIDER_SITE_OTHER): Payer: Self-pay

## 2018-04-05 VITALS — BP 116/82 | Temp 98.5°F | Ht 65.5 in | Wt 263.0 lb

## 2018-04-05 DIAGNOSIS — M545 Low back pain, unspecified: Secondary | ICD-10-CM

## 2018-04-05 DIAGNOSIS — G43111 Migraine with aura, intractable, with status migrainosus: Secondary | ICD-10-CM

## 2018-04-05 DIAGNOSIS — Z23 Encounter for immunization: Secondary | ICD-10-CM

## 2018-04-05 DIAGNOSIS — Z6831 Body mass index (BMI) 31.0-31.9, adult: Secondary | ICD-10-CM

## 2018-04-05 DIAGNOSIS — Z8249 Family history of ischemic heart disease and other diseases of the circulatory system: Secondary | ICD-10-CM

## 2018-04-05 DIAGNOSIS — Z0000 Encounter for general adult medical examination without abnormal findings: Secondary | ICD-10-CM

## 2018-04-05 DIAGNOSIS — M4807 Spinal stenosis, lumbosacral region: Secondary | ICD-10-CM | POA: Diagnosis not present

## 2018-04-05 LAB — LIPID PANEL
Cholesterol: 157 mg/dL (ref 0–200)
HDL: 40 mg/dL (ref >39.00)
LDL CALC: 107 mg/dL — AB (ref 0–99)
NonHDL: 116.79
TRIGLYCERIDES: 51 mg/dL (ref 0.0–149.0)
Total CHOL/HDL Ratio: 4
VLDL: 10.2 mg/dL (ref 0.0–40.0)

## 2018-04-05 LAB — CBC WITH DIFFERENTIAL/PLATELET
BASOS ABS: 0 10*3/uL (ref 0.0–0.1)
Basophils Relative: 0.7 % (ref 0.0–3.0)
EOS ABS: 0.1 10*3/uL (ref 0.0–0.7)
Eosinophils Relative: 1.5 % (ref 0.0–5.0)
HCT: 36.8 % (ref 36.0–46.0)
HEMOGLOBIN: 12.1 g/dL (ref 12.0–15.0)
LYMPHS PCT: 26.3 % (ref 12.0–46.0)
Lymphs Abs: 1.6 10*3/uL (ref 0.7–4.0)
MCHC: 32.8 g/dL (ref 30.0–36.0)
MCV: 84.9 fl (ref 78.0–100.0)
MONO ABS: 0.4 10*3/uL (ref 0.1–1.0)
Monocytes Relative: 7.2 % (ref 3.0–12.0)
Neutro Abs: 4 10*3/uL (ref 1.4–7.7)
Neutrophils Relative %: 64.3 % (ref 43.0–77.0)
Platelets: 282 10*3/uL (ref 150.0–400.0)
RBC: 4.34 Mil/uL (ref 3.87–5.11)
RDW: 13.3 % (ref 11.5–15.5)
WBC: 6.2 10*3/uL (ref 4.0–10.5)

## 2018-04-05 LAB — COMPREHENSIVE METABOLIC PANEL
ALK PHOS: 90 U/L (ref 39–117)
ALT: 16 U/L (ref 0–35)
AST: 10 U/L (ref 0–37)
Albumin: 3.9 g/dL (ref 3.5–5.2)
BUN: 14 mg/dL (ref 6–23)
CALCIUM: 9 mg/dL (ref 8.4–10.5)
CO2: 28 mEq/L (ref 19–32)
CREATININE: 0.75 mg/dL (ref 0.40–1.20)
Chloride: 106 mEq/L (ref 96–112)
GFR: 119.45 mL/min (ref >60.00)
Glucose, Bld: 88 mg/dL (ref 70–99)
Potassium: 4 mEq/L (ref 3.5–5.1)
SODIUM: 143 meq/L (ref 135–145)
Total Bilirubin: 0.5 mg/dL (ref 0.2–1.2)
Total Protein: 6.8 g/dL (ref 6.0–8.3)

## 2018-04-05 LAB — TSH: TSH: 0.71 u[IU]/mL (ref 0.35–4.50)

## 2018-04-05 LAB — HEMOGLOBIN A1C: Hgb A1c MFr Bld: 5.4 % (ref 4.6–6.5)

## 2018-04-05 NOTE — Progress Notes (Signed)
Subjective:    Patient ID: Lori [name], female    DOB: 02-Nov-1990, 27 y.o.   MRN: [id number]  HPI Patient presents for yearly preventative medicine examination. She is a pleasant 27 year old female who  has a past medical history of Abnormal uterine bleeding and Migraine with aura.  Migraines - continues to get migraines. Maxalt has not worked. She has been seen by a headache clinic in [place] and was " tried a lot of medications." She would like to be referred to headache clinic. Getting multiple migraines a week. Has been seen by the eye doctor this year and no changes in prescriptions.   All immunizations and health maintenance protocols were reviewed with the patient and needed orders were placed. She needs flu shot   Appropriate screening laboratory values were ordered for the patient including screening of hyperlipidemia, renal function and hepatic function.  Medication reconciliation,  past medical history, social history, problem list and allergies were reviewed in detail with the patient  Goals were established with regard to weight loss, exercise, and  diet in compliance with medications. She is not exercising and is not eating healthy.  Wt Readings from Last 3 Encounters:  04/05/18 263 lb (119.3 kg)  07/27/17 262 lb 12.8 oz (119.2 kg)  05/08/17 258 lb (117 kg)   She is followed by GYN.  Acute concerns  1. Midline Low Back pain - has been present for a few months. Pain is worse with twisting and turning. 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   2. Family history of cardiac disease - She reports that her son whom is 40 years old was just diagnosed with A -fib. She also has a strong family history of cardiac disease. She would like to make sure her heart is ok. Denies any chest pain or palpitations but does have shortness of breath with exertion.   Review of Systems  Constitutional: Negative.   HENT: Negative.   Eyes:  Negative.   Respiratory: Positive for shortness of breath.   Cardiovascular: Negative.   Gastrointestinal: Negative.   Endocrine: Negative.   Genitourinary: Negative.   Musculoskeletal: Positive for back pain.  Skin: Negative.   Allergic/Immunologic: Negative.   Neurological: Negative.   Hematological: Negative.   Psychiatric/Behavioral: Negative.    Past Medical History:  Diagnosis Date  . Abnormal uterine bleeding   . Migraine with aura     Social History   Socioeconomic History  . Marital status: Single    Spouse name: Not on file  . Number of children: Not on file  . Years of education: Not on file  . Highest education level: Not on file  Occupational History  . Not on file  Social Needs  . Financial resource strain: Not on file  . Food insecurity:    Worry: Not on file    Inability: Not on file  . Transportation needs:    Medical: Not on file    Non-medical: Not on file  Tobacco Use  . Smoking status: Never Smoker  . Smokeless tobacco: Never Used  Substance and Sexual Activity  . Alcohol use: Yes    Comment: social  . Drug use: No  . Sexual activity: Yes    Partners: Male    Birth control/protection: Surgical  Lifestyle  . Physical activity:    Days per week: Not on file    Minutes per session: Not on file  . Stress: Not  on file  Relationships  . Social connections:    Talks on phone: Not on file    Gets together: Not on file    Attends religious service: Not on file    Active member of club or organization: Not on file    Attends meetings of clubs or organizations: Not on file    Relationship status: Not on file  . Intimate partner violence:    Fear of current or ex partner: Not on file    Emotionally abused: Not on file    Physically abused: Not on file    Forced sexual activity: Not on file  Other Topics Concern  . 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 DILATION AND CURETTAGE, DIAGNOSTIC / THERAPEUTIC  2015  . TUBAL LIGATION  2014    Family History  Problem Relation Age of Onset  . Diabetes Mother   . Heart disease Mother   . Diabetes Father   . Kidney disease Father   . Asthma Sister   . Seizures Sister   . Asthma Daughter   . Von Willebrand disease Son   . Asthma Sister   . Diabetes Maternal Grandmother   . Heart disease Maternal Grandmother   . Diabetes Maternal Grandfather   . Diabetes Paternal Grandmother   . Lupus Paternal Grandmother   . Diabetes Paternal Grandfather     Allergies  Allergen Reactions  . Benylin Adult Formula [Dextromethorphan] Shortness Of Breath  . Benadryl [Diphenhydramine Hcl] Hives  . Codeine Itching  . Tdap [Tetanus-Diphth-Acell Pertussis]   . Tramadol Itching    Current Outpatient Medications on File Prior to Visit  Medication Sig Dispense Refill  . aspirin-acetaminophen-caffeine (EXCEDRIN MIGRAINE) 250-250-65 MG tablet Take by mouth every 6 (six) hours as needed for headache.    . medroxyPROGESTERone (PROVERA) 10 MG tablet Take one tablet po bid until bleeding slows down, then take one tablet a day for 10 days. 20 tablet 0  . rizatriptan (MAXALT) 10 MG tablet Take 10 mg by mouth as needed for migraine. May repeat in 2 hours if needed     No current facility-administered medications on file prior to visit.     BP 116/82   Temp 98.5 F (36.9 C)   Ht 5' 5.5" (1.664 m)   Wt 263 lb (119.3 kg)   BMI 43.10 kg/m       Objective:   Physical Exam  Constitutional: She is oriented to person, place, and time. She appears well-developed and well-nourished. No distress.  Obese   HENT:  Head: Normocephalic and atraumatic.  Right Ear: External ear normal.  Left Ear: External ear normal.  Nose: Nose normal.  Mouth/Throat: Oropharynx is clear and moist. No oropharyngeal exudate.  Eyes: Pupils are equal, round, and reactive to light.  Conjunctivae and EOM are normal. Right eye exhibits no discharge. Left eye exhibits no discharge. No scleral icterus.  Neck: Normal range of motion. Neck supple. No JVD present. No tracheal deviation present. No thyromegaly present.  Cardiovascular: Normal rate, regular rhythm, normal heart sounds and intact distal pulses. Exam reveals no gallop and no friction rub.  No murmur heard. Pulmonary/Chest: Effort normal and breath sounds normal. No stridor. No respiratory distress. She has no wheezes. She has no rales. She exhibits no tenderness.  Abdominal: Soft. Bowel sounds are normal. She exhibits no distension and no mass. There is no tenderness. There is no rebound and no guarding. No hernia.  Genitourinary:  Genitourinary Comments: Done by GYN    Musculoskeletal: Normal range of motion. She exhibits no edema, tenderness or deformity.  Lymphadenopathy:    She has no cervical adenopathy.  Neurological: She is alert and oriented to person, place, and time. She displays normal reflexes. No cranial nerve deficit or sensory deficit. She exhibits normal muscle tone. Coordination normal.  Skin: Skin is warm and dry. Capillary refill takes less than 2 seconds. No rash noted. She is not diaphoretic. No erythema. No pallor.  Psychiatric: She has a normal mood and affect. Her behavior is normal. Judgment and thought content normal.  Nursing note and vitals reviewed.     Assessment & Plan:  1. Routine general medical examination at a health care facility - One year follow up  - Hemoglobin A1c - Comprehensive metabolic panel - CBC with Differential/Platelet - Lipid panel - TSH  2. Intractable migraine with aura with status migrainosus  - AMB referral to headache clinic  3. Family history of heart disease - The most helpful thing she can do is to work on weight loss through diet and exercise  - Hemoglobin A1c - Comprehensive metabolic panel - CBC with Differential/Platelet - Lipid panel -  TSH - EKG 12-Lead- NSR, Rate 72 - SOB is likely from body habitus and lack of exercise   4. Acute midline low back pain without sciatica  - DG Lumbar Spine Complete; Future  5. BMI 31.0-31.9,adult - We discussed referral to weight loss clinic - she will think about it and let me know  - Encouraged exercise and heart healthy diet  - Hemoglobin A1c - Comprehensive metabolic panel - CBC with Differential/Platelet - Lipid panel - TSH  Shirline Frees, NP

## 2018-04-05 NOTE — Patient Instructions (Signed)
It was great seeing you today   Your EKG was normal.  Someone will call you to schedule your visit with the headache clinic   I will follow up with you regarding your blood work   The most important thing you can do at this point is work on weight loss through diet and exercise, especially with your family history of heart disease.

## 2018-04-11 ENCOUNTER — Telehealth: Payer: Self-pay

## 2018-04-11 NOTE — Telephone Encounter (Signed)
I Called the pt to inform she stated  she has already given  Her insurance information to the headache clinic now just awaiting on appointment

## 2018-04-11 NOTE — Telephone Encounter (Signed)
Copied from CRM [id number]. Topic: Referral - Question >> Apr 11, 2018  9:17 AM Tamela Oddi wrote: Reason for CRM: Gunnar Fusi from [facility]. Called to inform office that the referral that was received has inactive insurance information.  They tried to reach the patient to get updated insurance information, but have been unsuccessful.  Please advise.  CB# [id number].

## 2018-04-24 ENCOUNTER — Ambulatory Visit: Payer: 59 | Admitting: Obstetrics and Gynecology

## 2018-05-09 ENCOUNTER — Encounter: Payer: Self-pay | Admitting: Family Medicine

## 2018-05-09 ENCOUNTER — Ambulatory Visit (INDEPENDENT_AMBULATORY_CARE_PROVIDER_SITE_OTHER): Payer: BLUE CROSS/BLUE SHIELD | Admitting: Family Medicine

## 2018-05-09 ENCOUNTER — Ambulatory Visit: Payer: Self-pay

## 2018-05-09 VITALS — BP 108/60 | HR 93 | Temp 98.2°F | Ht 65.5 in | Wt 267.6 lb

## 2018-05-09 DIAGNOSIS — J029 Acute pharyngitis, unspecified: Secondary | ICD-10-CM

## 2018-05-09 LAB — POCT RAPID STREP A (OFFICE): RAPID STREP A SCREEN: NEGATIVE

## 2018-05-09 NOTE — Telephone Encounter (Signed)
Pt. Rep[orts she "has had a lot pf problems with sore throats." Reports this sore throat started Monday. When she swallows, "feels like I'm swallowing a golf ball." No runny nose or cough. No fever. Appointment made for today.  Reason for Disposition . SEVERE (e.g., excruciating) throat pain  Answer Assessment - Initial Assessment Questions 1. ONSET: "When did the throat start hurting?" (Hours or days ago)      Started Monday 2. SEVERITY: "How bad is the sore throat?" (Scale 1-10; mild, moderate or severe)   - MILD (1-3):  doesn't interfere with eating or normal activities   - MODERATE (4-7): interferes with eating some solids and normal activities   - SEVERE (8-10):  excruciating pain, interferes with most normal activities   - SEVERE DYSPHAGIA: can't swallow liquids, drooling     Moderate 3. STREP EXPOSURE: "Has there been any exposure to strep within the past week?" If so, ask: "What type of contact occurred?"      No 4.  VIRAL SYMPTOMS: "Are there any symptoms of a cold, such as a runny nose, cough, hoarse voice or red eyes?"      No 5. FEVER: "Do you have a fever?" If so, ask: "What is your temperature, how was it measured, and when did it start?"     No 6. PUS ON THE TONSILS: "Is there pus on the tonsils in the back of your throat?"     Unsure 7. OTHER SYMPTOMS: "Do you have any other symptoms?" (e.g., difficulty breathing, headache, rash)     Difficulty swallowing 8. PREGNANCY: "Is there any chance you are pregnant?" "When was your last menstrual period?"     No  Protocols used: SORE THROAT-A-AH

## 2018-05-09 NOTE — Patient Instructions (Signed)
BEFORE YOU LEAVE: -rapid strep test  Please follow up with the Ear, Nose and throat doctor if you have persistent concerns.   Sore Throat When you have a sore throat, your throat may:  Hurt.  Burn.  Feel irritated.  Feel scratchy.  Many things can cause a sore throat, including:  An infection.  Allergies.  Dryness in the air.  Smoke or pollution.  Gastroesophageal reflux disease (GERD).  Other  A sore throat can be the first sign of another sickness. It can happen with other problems, like coughing or a fever. Most sore throats go away without treatment. Follow these instructions at home:  Take over-the-counter medicines only as told by your doctor.  Drink enough fluids to keep your pee (urine) clear or pale yellow.  Rest when you feel you need to.  To help with pain, try: ? Sipping warm liquids, such as broth, herbal tea, or warm water. ? Eating or drinking cold or frozen liquids, such as frozen ice pops. ? Gargling with a salt-water mixture 3-4 times a day or as needed. To make a salt-water mixture, add -1 tsp of salt in 1 cup of warm water. Mix it until you cannot see the salt anymore. ? Sucking on hard candy or throat lozenges. ? Putting a cool-mist humidifier in your bedroom at night. ? Sitting in the bathroom with the door closed for 5-10 minutes while you run hot water in the shower.  Do not use any tobacco products, such as cigarettes, chewing tobacco, and e-cigarettes. If you need help quitting, ask your doctor. Contact a doctor if:  You have a fever for more than 2-3 days.  Your throat does not get better in 7 days.  You have a fever and your symptoms suddenly get worse. Get help right away if:  You have trouble breathing.  You cannot swallow fluids, soft foods, or your saliva.  You have swelling in your throat or neck that gets worse.  You keep feeling like you are going to throw up (vomit).  You keep throwing up. This information is not  intended to replace advice given to you by your health care provider. Make sure you discuss any questions you have with your health care provider. Document Released: 03/14/2008 Document Revised: 01/30/2016 Document Reviewed: 03/26/2015 Elsevier Interactive Patient Education  [employer]2018 [employer]

## 2018-05-09 NOTE — Progress Notes (Signed)
HPI:  Using dictation device. Unfortunately this device frequently misinterprets words/phrases.   Acute visit for respiratory illness: -started: 4-5 days ago -symptoms:sore throat -denies:fever, SOB, NVD, tooth pain, malaise -has tried: tea and cough drops -sick contacts/travel/risks: no reported flu, strep or tick exposure - but around a lot of people -Hx of: reports has sore throats for a few days several times per year, wonders if she can get her tonsils out, no hx strep, sleep apnea, tonsillar hypertrophy, GERD that she is aware of except with pregnanacy  ROS: See pertinent positives and negatives per HPI.  Past Medical History:  Diagnosis Date  . Abnormal uterine bleeding   . Migraine with aura     Past Surgical History:  Procedure Laterality Date  . DILATION AND CURETTAGE, DIAGNOSTIC / THERAPEUTIC  2015  . TUBAL LIGATION  2014    Family History  Problem Relation Age of Onset  . Diabetes Mother   . Heart disease Mother   . Diabetes Father   . Kidney disease Father   . Asthma Sister   . Seizures Sister   . Asthma Daughter   . Von Willebrand disease Son   . Asthma Sister   . Diabetes Maternal Grandmother   . Heart disease Maternal Grandmother   . Diabetes Maternal Grandfather   . Diabetes Paternal Grandmother   . Lupus Paternal Grandmother   . Diabetes Paternal Grandfather     Social History   Socioeconomic History  . Marital status: Single    Spouse name: Not on file  . Number of children: Not on file  . Years of education: Not on file  . Highest education level: Not on file  Occupational History  . Not on file  Social Needs  . Financial resource strain: Not on file  . Food insecurity:    Worry: Not on file    Inability: Not on file  . Transportation needs:    Medical: Not on file    Non-medical: Not on file  Tobacco Use  . Smoking status: Never Smoker  . Smokeless tobacco: Never Used  Substance and Sexual Activity  . Alcohol use: Yes   Comment: social  . Drug use: No  . Sexual activity: Yes    Partners: Male    Birth control/protection: Surgical  Lifestyle  . Physical activity:    Days per week: Not on file    Minutes per session: Not on file  . Stress: Not on file  Relationships  . Social connections:    Talks on phone: Not on file    Gets together: Not on file    Attends religious service: Not on file    Active member of club or organization: Not on file    Attends meetings of clubs or organizations: Not on file    Relationship status: Not on file  Other Topics Concern  . 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  aspirin-acetaminophen-caffeine (EXCEDRIN MIGRAINE) 250-250-65 MG tablet, Take by mouth every 6 (six) hours as needed for headache., Disp: , Rfl:  .  medroxyPROGESTERone (PROVERA) 10 MG tablet, Take one tablet po bid until bleeding slows down, then take one tablet a day for 10 days., Disp: 20 tablet, Rfl: 0 .  rizatriptan (MAXALT) 10 MG tablet, Take 10 mg by mouth as  needed for migraine. May repeat in 2 hours if needed, Disp: , Rfl:   EXAM:  Vitals:   05/09/18 1027  BP: 108/60  Pulse: 93  Temp: 98.2 F (36.8 C)    Body mass index is 43.85 kg/m.  GENERAL: vitals reviewed and listed above, alert, oriented, appears well hydrated and in no acute distress  HEENT: atraumatic, conjunttiva clear, no obvious abnormalities on inspection of external nose and ears, normal appearance of ear canals and TMs, clear nasal congestion, mild post oropharyngeal erythema with PND, no tonsillar edema or exudate, no sinus TTP  NECK: no obvious masses on inspection  LUNGS: clear to auscultation bilaterally, no wheezes, rales or rhonchi, good air movement  CV: HRRR, no peripheral edema  MS: moves all extremities without noticeable abnormality  PSYCH: pleasant and cooperative, no obvious  depression or anxiety  ASSESSMENT AND PLAN:  Discussed the following assessment and plan:  Sore throat  -given HPI and exam findings today, a serious infection or illness is unlikely. We discussed potential etiologies, with VURI being most likely, and advised supportive care and monitoring. We discussed treatment side effects, likely course, antibiotic misuse, transmission, and signs of developing a serious illness. -strep test negative -recommended follow up with ENT if persistent or recurrent issues -of course, we advised to return or notify a doctor immediately if symptoms worsen or persist or new concerns arise.    Patient Instructions  BEFORE YOU LEAVE: -rapid strep test  Please follow up with the Ear, Nose and throat doctor if you have persistent concerns.   Sore Throat When you have a sore throat, your throat may:  Hurt.  Burn.  Feel irritated.  Feel scratchy.  Many things can cause a sore throat, including:  An infection.  Allergies.  Dryness in the air.  Smoke or pollution.  Gastroesophageal reflux disease (GERD).  Other  A sore throat can be the first sign of another sickness. It can happen with other problems, like coughing or a fever. Most sore throats go away without treatment. Follow these instructions at home:  Take over-the-counter medicines only as told by your doctor.  Drink enough fluids to keep your pee (urine) clear or pale yellow.  Rest when you feel you need to.  To help with pain, try: ? Sipping warm liquids, such as broth, herbal tea, or warm water. ? Eating or drinking cold or frozen liquids, such as frozen ice pops. ? Gargling with a salt-water mixture 3-4 times a day or as needed. To make a salt-water mixture, add -1 tsp of salt in 1 cup of warm water. Mix it until you cannot see the salt anymore. ? Sucking on hard candy or throat lozenges. ? Putting a cool-mist humidifier in your bedroom at night. ? Sitting in the bathroom  with the door closed for 5-10 minutes while you run hot water in the shower.  Do not use any tobacco products, such as cigarettes, chewing tobacco, and e-cigarettes. If you need help quitting, ask your doctor. Contact a doctor if:  You have a fever for more than 2-3 days.  Your throat does not get better in 7 days.  You have a fever and your symptoms suddenly get worse. Get help right away if:  You have trouble breathing.  You cannot swallow fluids, soft foods, or your saliva.  You have swelling in your throat or neck that gets worse.  You keep feeling like you are going to throw up (vomit).  You keep throwing up. This  information is not intended to replace advice given to you by your health care provider. Make sure you discuss any questions you have with your health care provider. Document Released: 03/14/2008 Document Revised: 01/30/2016 Document Reviewed: 03/26/2015 Elsevier Interactive Patient Education  2018 [employer].     Terressa Koyanagi, DO

## 2018-05-09 NOTE — Addendum Note (Signed)
Addended by: Johnella MoloneyFUNDERBURK, JO A on: 05/09/2018 10:59 AM   Modules accepted: Orders

## 2018-08-12 ENCOUNTER — Telehealth: Payer: BLUE CROSS/BLUE SHIELD | Admitting: Family

## 2018-08-12 DIAGNOSIS — R6889 Other general symptoms and signs: Secondary | ICD-10-CM

## 2018-08-12 DIAGNOSIS — R0602 Shortness of breath: Secondary | ICD-10-CM

## 2018-08-12 NOTE — Progress Notes (Signed)
Based on what you shared with me it looks like you have a serious condition that should be evaluated in a face to face office visit.  NOTE: If you entered your credit card information for this eVisit, you will not be charged. You may see a "hold" on your card for the $30 but that hold will drop off and you will not have a charge processed.   Since you're having shortness of breath I feel that it would be best to be seen face-to-face to be evaluated.  Approximately five minutes was spent reviewing the document patient's chart.  If you are having a true medical emergency please call 911.  If you need an urgent face to face visit, [facility] has four urgent care centers for your convenience.  If you need care fast and have a high deductible or no insurance consider:   [email or web address] to reserve your spot online an avoid wait times  [facility] [address] [place], [place] [address] 8 am to 8 pm Monday-Friday 10 am to 4 pm Saturday-Sunday *Across the street from [employer]  [address] [place], [address] 8 am to 5 pm Monday-Friday * In the [facility] on the [facility]   The following sites will take your  insurance:  . [facility]  [phone] Get Driving Directions Find a Provider at this Location  [address] [place], [place] [address] . 10 am to 8 pm Monday-Friday . 12 pm to 8 pm Saturday-Sunday   . [facility] at [facility]  [phone] Get Driving Directions Find a Provider at this Location  [address], [address] [place], [place] [address] . 8 am to 8 pm Monday-Friday . 9 am to 6 pm Saturday . 11 am to 6 pm Sunday   . [facility] at [facility]  [phone] Get Driving Directions  [address] [place], [place] [address] . 8 am to 8 pm Monday-Friday . 8 am to 4 pm Saturday-Sunday   Your e-visit  answers were reviewed by a board certified advanced clinical practitioner to complete your personal care plan.  Thank you for using e-Visits.

## 2018-08-13 ENCOUNTER — Encounter: Payer: Self-pay | Admitting: Family Medicine

## 2018-08-13 ENCOUNTER — Ambulatory Visit (INDEPENDENT_AMBULATORY_CARE_PROVIDER_SITE_OTHER): Payer: Commercial Managed Care - PPO | Admitting: Adult Health

## 2018-08-13 ENCOUNTER — Encounter: Payer: Self-pay | Admitting: Adult Health

## 2018-08-13 VITALS — BP 116/74 | Temp 98.4°F | Wt 271.0 lb

## 2018-08-13 DIAGNOSIS — R6889 Other general symptoms and signs: Secondary | ICD-10-CM

## 2018-08-13 DIAGNOSIS — J029 Acute pharyngitis, unspecified: Secondary | ICD-10-CM | POA: Diagnosis not present

## 2018-08-13 LAB — POC INFLUENZA A&B (BINAX/QUICKVUE)
INFLUENZA B, POC: NEGATIVE
Influenza A, POC: NEGATIVE

## 2018-08-13 LAB — POCT RAPID STREP A (OFFICE): Rapid Strep A Screen: NEGATIVE

## 2018-08-13 MED ORDER — PREDNISONE 20 MG PO TABS: 20.0000 mg | ORAL_TABLET | Freq: Every day | ORAL | 0 refills | Status: DC

## 2018-08-13 MED FILL — predniSONE 20 MG TABS: 20 | 7 days supply | Qty: 7 | Fill #0

## 2018-08-13 NOTE — Progress Notes (Signed)
Subjective:    Patient ID: Lori [name], female    DOB: 04/13/1991, 28 y.o.   MRN: [id number]  HPI 28 year old female who  has a past medical history of Abnormal uterine bleeding and Migraine with aura.  She presents to the office today for flu like symptoms that started 2 days ago. Her symptoms include that of sore throat, body aches, generalized fatigue, fevers up to 101 and a single episode of vomiting.   She denies nausea, chills, or diarrhea   Review of Systems See HPI   Past Medical History:  Diagnosis Date  . Abnormal uterine bleeding   . Migraine with aura     Social History   Socioeconomic History  . Marital status: Single    Spouse name: Not on file  . Number of children: Not on file  . Years of education: Not on file  . Highest education level: Not on file  Occupational History  . Not on file  Social Needs  . Financial resource strain: Not on file  . Food insecurity:    Worry: Not on file    Inability: Not on file  . Transportation needs:    Medical: Not on file    Non-medical: Not on file  Tobacco Use  . Smoking status: Never Smoker  . Smokeless tobacco: Never Used  Substance and Sexual Activity  . Alcohol use: Yes    Comment: social  . Drug use: No  . Sexual activity: Yes    Partners: Male    Birth control/protection: Surgical  Lifestyle  . Physical activity:    Days per week: Not on file    Minutes per session: Not on file  . Stress: Not on file  Relationships  . Social connections:    Talks on phone: Not on file    Gets together: Not on file    Attends religious service: Not on file    Active member of club or organization: Not on file    Attends meetings of clubs or organizations: Not on file    Relationship status: Not on file  . Intimate partner violence:    Fear of current or ex partner: Not on file    Emotionally abused: Not on file    Physically abused: Not on file    Forced sexual activity: Not on file  Other Topics  Concern  . 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 DILATION AND CURETTAGE, DIAGNOSTIC / THERAPEUTIC  2015  . TUBAL LIGATION  2014    Family History  Problem Relation Age of Onset  . Diabetes Mother   . Heart disease Mother   . Diabetes Father   . Kidney disease Father   . Asthma Sister   . Seizures Sister   . Asthma Daughter   . Von Willebrand disease Son   . Asthma Sister   . Diabetes Maternal Grandmother   . Heart disease Maternal Grandmother   . Diabetes Maternal Grandfather   . Diabetes Paternal Grandmother   . Lupus Paternal Grandmother   . Diabetes Paternal Grandfather     Allergies  Allergen Reactions  . Benylin Adult Formula [Dextromethorphan] Shortness Of Breath  . Benadryl [Diphenhydramine Hcl] Hives  . Codeine Itching  . Tdap [Tetanus-Diphth-Acell Pertussis]   .  Tramadol Itching    Current Outpatient Medications on File Prior to Visit  Medication Sig Dispense Refill  . aspirin-acetaminophen-caffeine (EXCEDRIN MIGRAINE) 250-250-65 MG tablet Take by mouth every 6 (six) hours as needed for headache.    . medroxyPROGESTERone (PROVERA) 10 MG tablet Take one tablet po bid until bleeding slows down, then take one tablet a day for 10 days. 20 tablet 0  . rizatriptan (MAXALT) 10 MG tablet Take 10 mg by mouth as needed for migraine. May repeat in 2 hours if needed     No current facility-administered medications on file prior to visit.     BP 116/74   Temp 98.4 F (36.9 C)   Wt 271 lb (122.9 kg)   BMI 44.41 kg/m       Objective:   Physical Exam Vitals signs reviewed.  Constitutional:      Appearance: Normal appearance. She is ill-appearing.  HENT:     Head: Atraumatic.     Right Ear: Tympanic membrane, ear canal and external ear normal. There is no impacted cerumen.     Left Ear: Tympanic membrane, ear canal  and external ear normal. There is no impacted cerumen.     Nose: Nose normal. No congestion.     Mouth/Throat:     Mouth: Mucous membranes are moist.     Palate: No mass.     Pharynx: Posterior oropharyngeal erythema (mild) present.     Tonsils: No tonsillar exudate or tonsillar abscesses. Swelling: 2+ on the right. 2+ on the left.  Cardiovascular:     Rate and Rhythm: Normal rate and regular rhythm.     Pulses: Normal pulses.     Heart sounds: Normal heart sounds.  Pulmonary:     Effort: Pulmonary effort is normal.     Breath sounds: Normal breath sounds.  Musculoskeletal: Normal range of motion.  Skin:    General: Skin is warm and dry.     Capillary Refill: Capillary refill takes less than 2 seconds.  Neurological:     General: No focal deficit present.     Mental Status: She is alert and oriented to person, place, and time. Mental status is at baseline.  Psychiatric:        Mood and Affect: Mood normal.        Behavior: Behavior normal.        Thought Content: Thought content normal.        Judgment: Judgment normal.       Assessment & Plan:  1. Flu-like symptoms  - POC Influenza A&B(BINAX/QUICKVUE)- negative   2. Sore throat  - POC Rapid Strep A- negative  - predniSONE (DELTASONE) 20 MG tablet; Take 1 tablet (20 mg total) by mouth daily with breakfast.  Dispense: 7 tablet; Refill: 0 - Follow up in 2-3 days if no improvement  - Rest and stay hydrated    Shirline Frees, NP

## 2019-08-22 ENCOUNTER — Telehealth: Payer: Self-pay | Admitting: Adult Health

## 2019-08-22 NOTE — Telephone Encounter (Signed)
Pt requested an appt through my-chart for chest pains and swelling. I called pt to find out more information and she informed me that she is having swelling in her hands. Pt stated she tested pos for strep and was prescribed amoxicillin but ended up having an allergic reaction--swelling and pain in chest. I transferred the pt to nurse triage due to her symptoms. Nothing further

## 2019-08-28 ENCOUNTER — Other Ambulatory Visit: Payer: Self-pay

## 2019-08-29 ENCOUNTER — Ambulatory Visit (INDEPENDENT_AMBULATORY_CARE_PROVIDER_SITE_OTHER): Payer: BC Managed Care – PPO | Admitting: Adult Health

## 2019-08-29 ENCOUNTER — Encounter: Payer: Self-pay | Admitting: Adult Health

## 2019-08-29 VITALS — BP 124/80 | Temp 97.6°F | Ht 66.0 in | Wt 287.0 lb

## 2019-08-29 DIAGNOSIS — E668 Other obesity: Secondary | ICD-10-CM

## 2019-08-29 DIAGNOSIS — G43109 Migraine with aura, not intractable, without status migrainosus: Secondary | ICD-10-CM

## 2019-08-29 DIAGNOSIS — T7840XA Allergy, unspecified, initial encounter: Secondary | ICD-10-CM | POA: Diagnosis not present

## 2019-08-29 DIAGNOSIS — Z0000 Encounter for general adult medical examination without abnormal findings: Secondary | ICD-10-CM

## 2019-08-29 LAB — COMPREHENSIVE METABOLIC PANEL
ALT: 14 U/L (ref 0–35)
AST: 11 U/L (ref 0–37)
Albumin: 3.7 g/dL (ref 3.5–5.2)
Alkaline Phosphatase: 88 U/L (ref 39–117)
BUN: 14 mg/dL (ref 6–23)
CO2: 27 mEq/L (ref 19–32)
Calcium: 8.7 mg/dL (ref 8.4–10.5)
Chloride: 106 mEq/L (ref 96–112)
Creatinine, Ser: 0.58 mg/dL (ref 0.40–1.20)
GFR: 149.64 mL/min (ref >60.00)
Glucose, Bld: 94 mg/dL (ref 70–99)
Potassium: 4.2 mEq/L (ref 3.5–5.1)
Sodium: 138 mEq/L (ref 135–145)
Total Bilirubin: 0.4 mg/dL (ref 0.2–1.2)
Total Protein: 6.8 g/dL (ref 6.0–8.3)

## 2019-08-29 LAB — CBC WITH DIFFERENTIAL/PLATELET
Basophils Absolute: 0 10*3/uL (ref 0.0–0.1)
Basophils Relative: 0.7 % (ref 0.0–3.0)
Eosinophils Absolute: 0.4 10*3/uL (ref 0.0–0.7)
Eosinophils Relative: 6.9 % — ABNORMAL HIGH (ref 0.0–5.0)
HCT: 38.2 % (ref 36.0–46.0)
Hemoglobin: 12.6 g/dL (ref 12.0–15.0)
Lymphocytes Relative: 29.6 % (ref 12.0–46.0)
Lymphs Abs: 1.9 10*3/uL (ref 0.7–4.0)
MCHC: 33 g/dL (ref 30.0–36.0)
MCV: 85.9 fl (ref 78.0–100.0)
Monocytes Absolute: 0.4 10*3/uL (ref 0.1–1.0)
Monocytes Relative: 7 % (ref 3.0–12.0)
Neutro Abs: 3.6 10*3/uL (ref 1.4–7.7)
Neutrophils Relative %: 55.8 % (ref 43.0–77.0)
Platelets: 336 10*3/uL (ref 150.0–400.0)
RBC: 4.45 Mil/uL (ref 3.87–5.11)
RDW: 13.7 % (ref 11.5–15.5)
WBC: 6.4 10*3/uL (ref 4.0–10.5)

## 2019-08-29 LAB — TSH: TSH: 0.68 u[IU]/mL (ref 0.35–4.50)

## 2019-08-29 LAB — LIPID PANEL
Cholesterol: 166 mg/dL (ref 0–200)
HDL: 41.7 mg/dL (ref >39.00)
LDL Cholesterol: 111 mg/dL — ABNORMAL HIGH (ref 0–99)
NonHDL: 124.37
Total CHOL/HDL Ratio: 4
Triglycerides: 67 mg/dL (ref 0.0–149.0)
VLDL: 13.4 mg/dL (ref 0.0–40.0)

## 2019-08-29 LAB — HEMOGLOBIN A1C: Hgb A1c MFr Bld: 5.4 % (ref 4.6–6.5)

## 2019-08-29 IMAGING — DX DG LUMBAR SPINE COMPLETE 4+V
5 series · 5 of 5 positions shown · non-contrast
Comparison: None.

CLINICAL DATA: 27-year-old female with a history of lumbar back
pain

EXAM:
LUMBAR SPINE - COMPLETE 4+ VIEW

[lumbar spine ap]
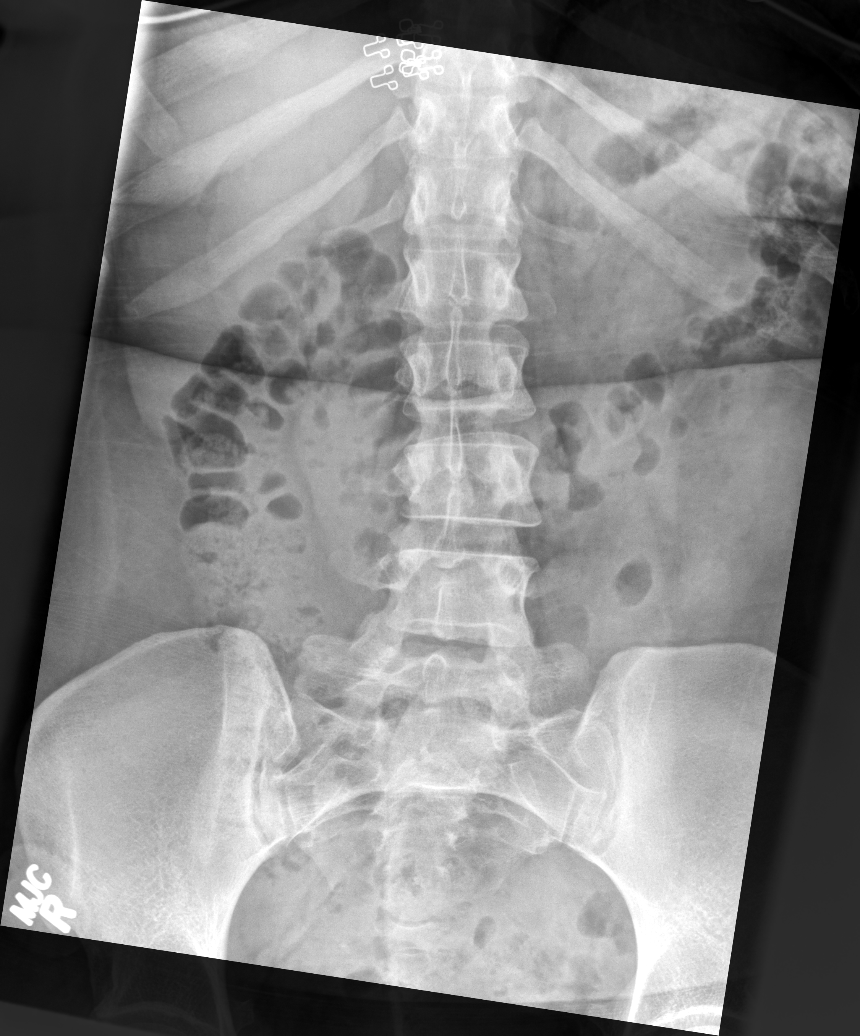

[lumbar spine oblique (1 of 2)]
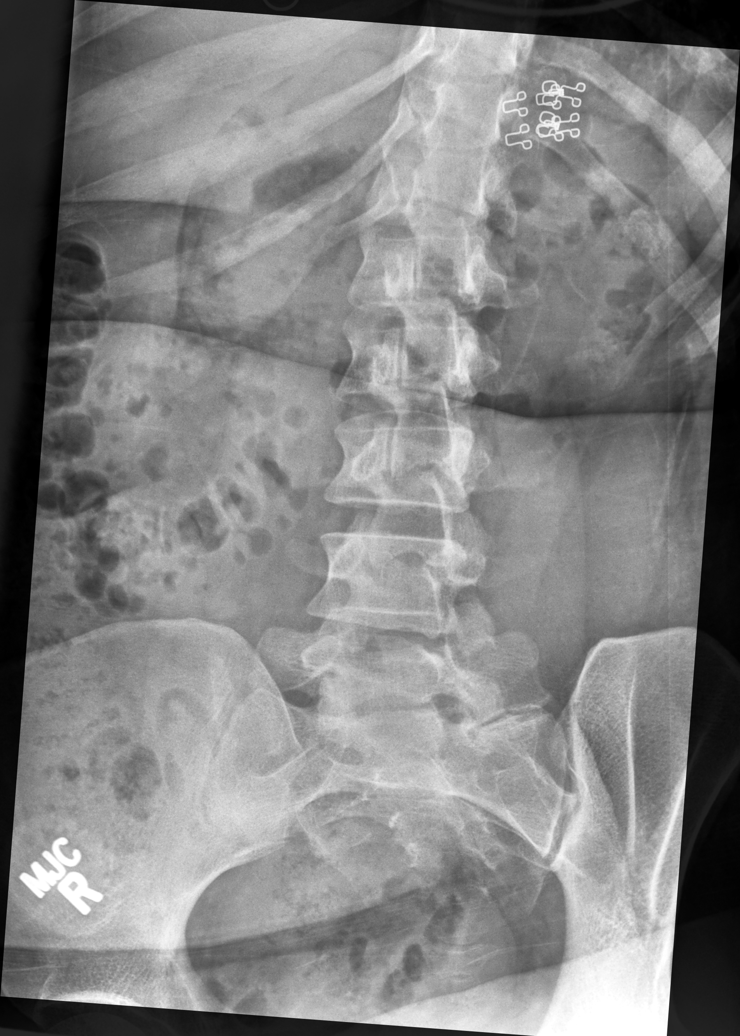

[lumbar spine oblique (2 of 2)]
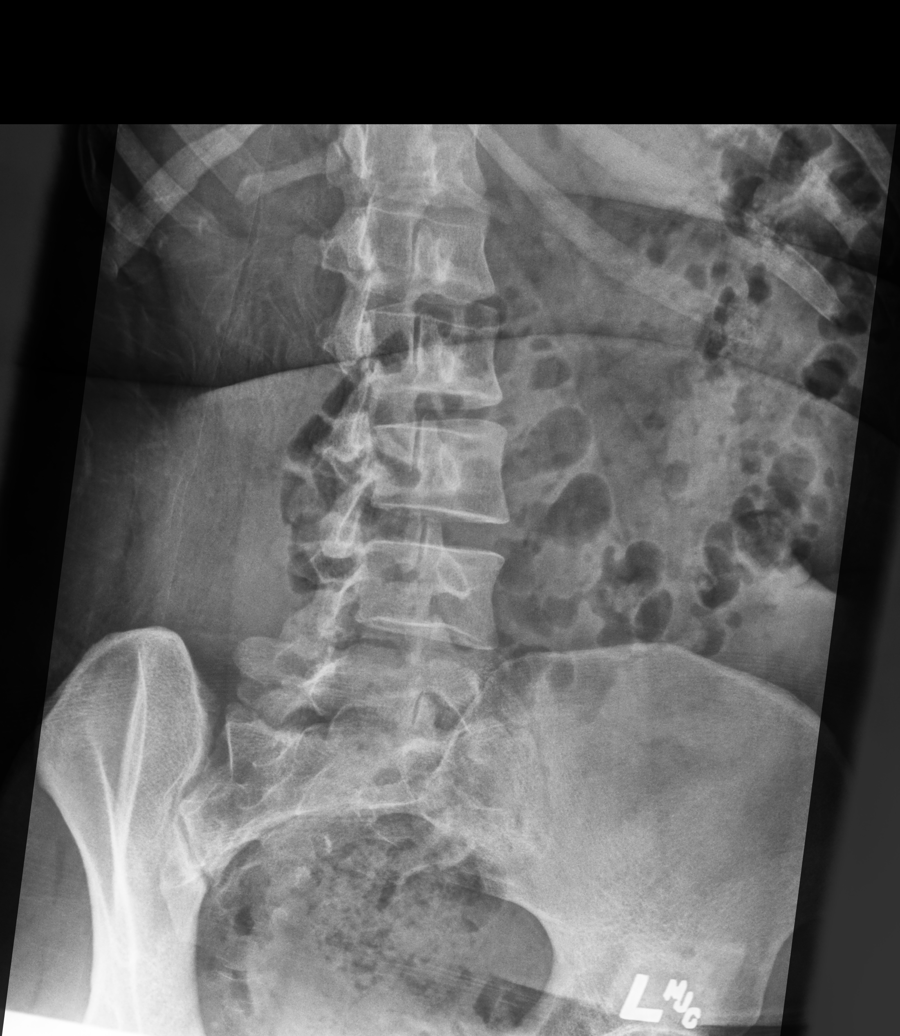

[lumbar spine lat (1 of 2)]
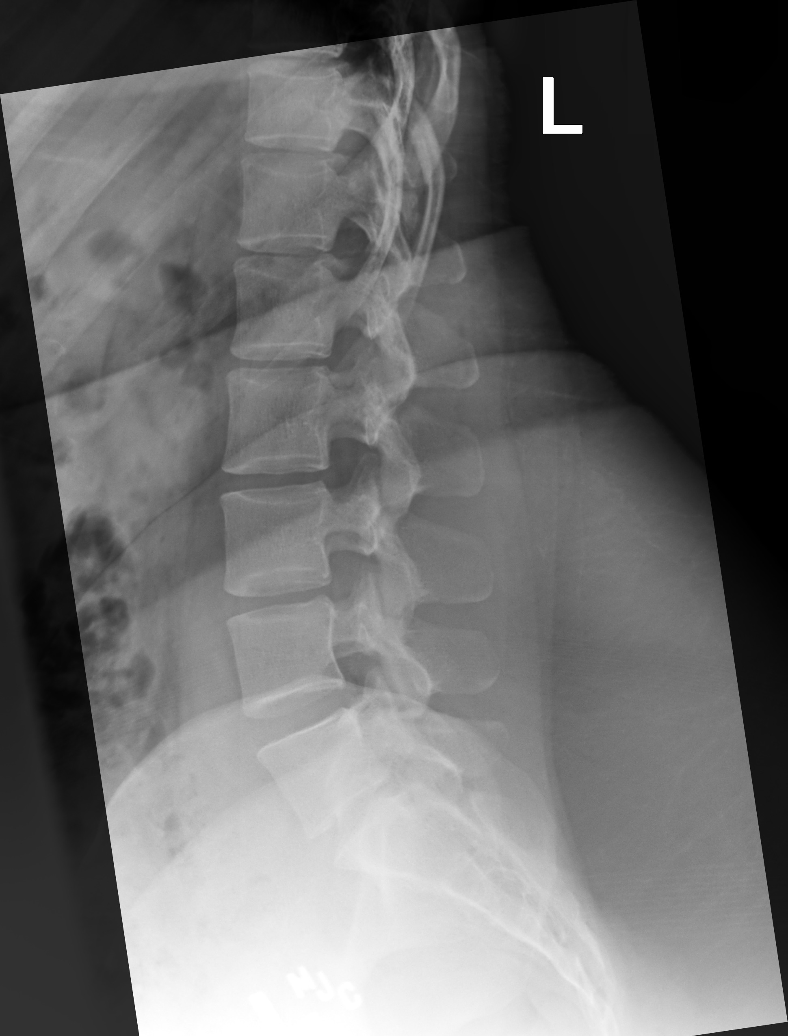

[lumbar spine lat (2 of 2)]
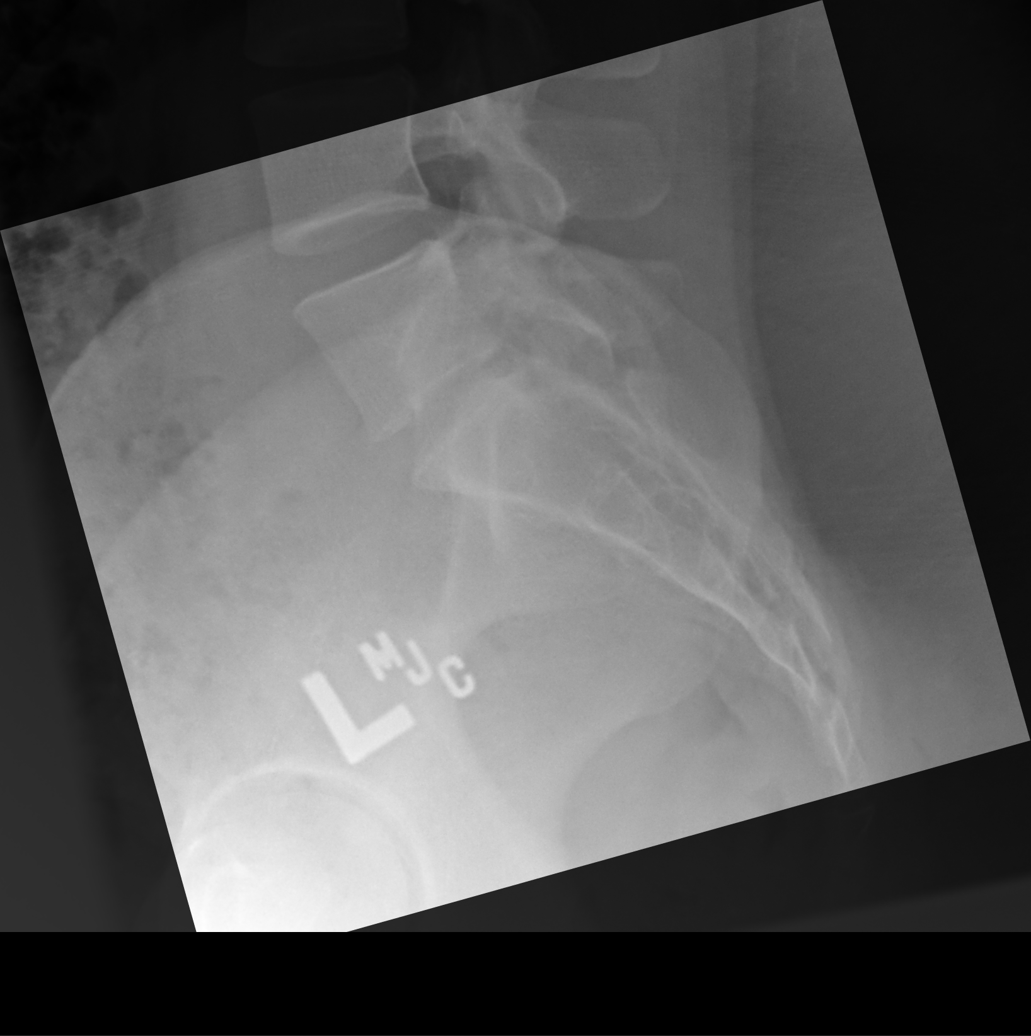

[5 of 5 positions shown; findings below may reference images not displayed]

FINDINGS: Lumbar Spine:

Lumbar vertebral elements maintain normal alignment without evidence
of anterolisthesis, retrolisthesis, subluxation. Mild apex left
curvature is unchanged from the prior CT.

No acute fracture line identified.

Vertebral body heights maintained.

Mild disc space narrowing at L5-S1.

Oblique images demonstrate no displaced pars defect.
Pseudoarticulation of the left L5 transverse process.

Unremarkable appearance of the visualized abdomen.
IMPRESSION: No acute fracture or malalignment of the lumbar spine.

## 2019-08-29 MED ORDER — PREDNISONE 20 MG PO TABS: 20.0000 mg | ORAL_TABLET | Freq: Every day | ORAL | 0 refills | Status: DC

## 2019-08-29 MED ORDER — AMITRIPTYLINE HCL 25 MG PO TABS: 25.0000 mg | ORAL_TABLET | Freq: Every day | ORAL | 0 refills | Status: DC

## 2019-08-29 NOTE — Progress Notes (Signed)
Subjective:    Patient ID: Lori [name], female    DOB: [birth year], 29 y.o.   MRN: [id number]  HPI Patient presents for yearly preventative medicine examination. She is a pleasant 29 year old female who  has a past medical history of Abnormal uterine bleeding and Migraine with aura.   Migraines -has been seen previously at a headache clinic in [facility].  She was tried on a lot of medications but none of these seem to work for her chronic headaches.  In 2019 she was referred to the headache clinic here but with the insurance she had that they wanted $800 upfront and she cannot afford that.  She continues to get migraines, approximately 2-3 a week and she takes any other Excedrin Migraine or Tylenol until they resolve.  Allergic reaction -was recently placed on amoxicillin for diagnosis of strep throat.  After the first dose couple of doses she developed hives and swelling in her lower extremities.  She was subsequently placed on azithromycin but has not taken this yet due to concern of allergic reaction.  She is unable to take Benadryl as this causes hives for her.  All immunizations and health maintenance protocols were reviewed with the patient and needed orders were placed.  Appropriate screening laboratory values were ordered for the patient including screening of hyperlipidemia, renal function and hepatic function.   Medication reconciliation,  past medical history, social history, problem list and allergies were reviewed in detail with the patient  Goals were established with regard to weight loss, exercise, and  diet in compliance with medications.    Wt Readings from Last 3 Encounters:  08/29/19 287 lb (130.2 kg)  08/13/18 271 lb (122.9 kg)  05/09/18 267 lb 9.6 oz (121.4 kg)   She is seen by GYN    Review of Systems  Constitutional: Negative.   HENT: Negative.   Eyes: Negative.   Respiratory: Negative.   Cardiovascular: Negative.   Gastrointestinal:  Negative.   Endocrine: Negative.   Genitourinary: Negative.   Musculoskeletal: Negative.   Skin: Positive for rash.  Allergic/Immunologic: Negative.   Neurological: Negative.   Hematological: Negative.   Psychiatric/Behavioral: Negative.    Past Medical History:  Diagnosis Date  . Abnormal uterine bleeding   . Migraine with aura     Social History   Socioeconomic History  . Marital status: Single    Spouse name: Not on file  . Number of children: Not on file  . Years of education: Not on file  . Highest education level: Not on file  Occupational History  . Not on file  Tobacco Use  . Smoking status: Never Smoker  . Smokeless tobacco: Never Used  Substance and Sexual Activity  . Alcohol use: Yes    Comment: social  . Drug use: No  . Sexual activity: Yes    Partners: Male    Birth control/protection: Surgical  Other Topics Concern  . Not on file  Social History Narrative   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   Two children ( daughter 67 , son 3)       Social Determinants of Corporate investment banker Strain:   . Difficulty of Paying Living Expenses:   Food Insecurity:   . Worried About Programme researcher, broadcasting/film/video in the Last Year:   . Barista in the Last Year:   Transportation Needs:   . Freight forwarder (Medical):   Marland Kitchen  Lack of Transportation (Non-Medical):   Physical Activity:   . Days of Exercise per Week:   . Minutes of Exercise per Session:   Stress:   . Feeling of Stress :   Social Connections:   . Frequency of Communication with Friends and Family:   . Frequency of Social Gatherings with Friends and Family:   . Attends Religious Services:   . Active Member of Clubs or Organizations:   . Attends Banker Meetings:   Marland Kitchen Marital Status:   Intimate Partner Violence:   . Fear of Current or Ex-Partner:   . Emotionally Abused:   Marland Kitchen Physically Abused:   . Sexually Abused:     Past Surgical History:  Procedure Laterality Date    . DILATION AND CURETTAGE, DIAGNOSTIC / THERAPEUTIC  2015  . TUBAL LIGATION  2014    Family History  Problem Relation Age of Onset  . Diabetes Mother   . Heart disease Mother   . Diabetes Father   . Kidney disease Father   . Asthma Sister   . Seizures Sister   . Asthma Daughter   . Von Willebrand disease Son   . Asthma Sister   . Diabetes Maternal Grandmother   . Heart disease Maternal Grandmother   . Diabetes Maternal Grandfather   . Diabetes Paternal Grandmother   . Lupus Paternal Grandmother   . Diabetes Paternal Grandfather     Allergies  Allergen Reactions  . Benylin Adult Formula [Dextromethorphan] Shortness Of Breath  . Amoxicillin Hives, Itching and Swelling  . Benadryl [Diphenhydramine Hcl] Hives  . Codeine Itching  . Tdap [Tetanus-Diphth-Acell Pertussis]   . Tramadol Itching    Current Outpatient Medications on File Prior to Visit  Medication Sig Dispense Refill  . aspirin-acetaminophen-caffeine (EXCEDRIN MIGRAINE) 250-250-65 MG tablet Take by mouth every 6 (six) hours as needed for headache.    . predniSONE (DELTASONE) 20 MG tablet Take 1 tablet (20 mg total) by mouth daily with breakfast. 7 tablet 0   No current facility-administered medications on file prior to visit.    BP 124/80   Temp 97.6 F (36.4 C)   Ht 5\' 6"  (1.676 m)   Wt 287 lb (130.2 kg)   BMI 46.32 kg/m       Objective:   Physical Exam Vitals and nursing note reviewed.  Constitutional:      General: She is not in acute distress.    Appearance: Normal appearance. She is well-developed. She is obese. She is not ill-appearing.  HENT:     Head: Normocephalic and atraumatic.     Right Ear: Tympanic membrane, ear canal and external ear normal. There is no impacted cerumen.     Left Ear: Tympanic membrane, ear canal and external ear normal. There is no impacted cerumen.     Nose: Nose normal. No congestion or rhinorrhea.     Mouth/Throat:     Mouth: Mucous membranes are moist.      Pharynx: Oropharynx is clear. No oropharyngeal exudate or posterior oropharyngeal erythema.  Eyes:     General:        Right eye: No discharge.        Left eye: No discharge.     Extraocular Movements: Extraocular movements intact.     Conjunctiva/sclera: Conjunctivae normal.     Pupils: Pupils are equal, round, and reactive to light.  Neck:     Thyroid: No thyromegaly.     Vascular: No carotid bruit.     Trachea: No  tracheal deviation.  Cardiovascular:     Rate and Rhythm: Normal rate and regular rhythm.     Pulses: Normal pulses.     Heart sounds: Normal heart sounds. No murmur. No friction rub. No gallop.   Pulmonary:     Effort: Pulmonary effort is normal. No respiratory distress.     Breath sounds: Normal breath sounds. No stridor. No wheezing, rhonchi or rales.  Chest:     Chest wall: No tenderness.  Abdominal:     General: Abdomen is flat. Bowel sounds are normal. There is no distension.     Palpations: Abdomen is soft. There is no mass.     Tenderness: There is no abdominal tenderness. There is no right CVA tenderness, left CVA tenderness, guarding or rebound.     Hernia: No hernia is present.  Musculoskeletal:        General: No swelling, tenderness, deformity or signs of injury. Normal range of motion.     Cervical back: Normal range of motion and neck supple.     Right lower leg: No edema.     Left lower leg: No edema.  Lymphadenopathy:     Cervical: No cervical adenopathy.  Skin:    General: Skin is warm and dry.     Coloration: Skin is not jaundiced or pale.     Findings: Rash present. No bruising, erythema or lesion.     Comments: Small patches of [place] area noted on bilateral forearms and around her ankles.  Neurological:     General: No focal deficit present.     Mental Status: She is alert and oriented to person, place, and time.     Cranial Nerves: No cranial nerve deficit.     Sensory: No sensory deficit.     Motor: No weakness.     Coordination:  Coordination normal.     Gait: Gait normal.     Deep Tendon Reflexes: Reflexes normal.  Psychiatric:        Mood and Affect: Mood normal.        Behavior: Behavior normal.        Thought Content: Thought content normal.        Judgment: Judgment normal.       Assessment & Plan:  1. Routine general medical examination at a health care facility - Needs to work on losing weight  - Follow up in one year for CPE  - CBC with Differential/Platelet - Comprehensive metabolic panel - Hemoglobin A1c - Lipid panel - TSH  2. Migraine with aura and without status migrainosus, not intractable - Will trial her on Elavil 25 mg QHS  - Follow up via mychart in the next few weeks  - CBC with Differential/Platelet - Comprehensive metabolic panel - Hemoglobin A1c - Lipid panel - TSH - amitriptyline (ELAVIL) 25 MG tablet; Take 1 tablet (25 mg total) by mouth at bedtime.  Dispense: 30 tablet; Refill: 0  3. Other obesity - Encouraged aerobic exercise and heart healthy diet - CBC with Differential/Platelet - Comprehensive metabolic panel - Hemoglobin A1c - Lipid panel - TSH  4. Allergic reaction, initial encounter  - predniSONE (DELTASONE) 20 MG tablet; Take 1 tablet (20 mg total) by mouth daily with breakfast.  Dispense: 10 tablet; Refill: 0  Dorothyann Peng, NP

## 2019-09-21 ENCOUNTER — Other Ambulatory Visit: Payer: Self-pay | Admitting: Adult Health

## 2019-09-21 DIAGNOSIS — G43109 Migraine with aura, not intractable, without status migrainosus: Secondary | ICD-10-CM

## 2019-09-23 DIAGNOSIS — Z6841 Body Mass Index (BMI) 40.0 and over, adult: Secondary | ICD-10-CM | POA: Diagnosis not present

## 2019-09-23 DIAGNOSIS — N939 Abnormal uterine and vaginal bleeding, unspecified: Secondary | ICD-10-CM | POA: Diagnosis not present

## 2019-09-23 DIAGNOSIS — Z01419 Encounter for gynecological examination (general) (routine) without abnormal findings: Secondary | ICD-10-CM | POA: Diagnosis not present

## 2019-09-23 NOTE — Telephone Encounter (Signed)
Sent to the pharmacy by e-scribe. 

## 2019-10-01 DIAGNOSIS — Z114 Encounter for screening for human immunodeficiency virus [HIV]: Secondary | ICD-10-CM | POA: Diagnosis not present

## 2019-10-01 DIAGNOSIS — Z1159 Encounter for screening for other viral diseases: Secondary | ICD-10-CM | POA: Diagnosis not present

## 2019-10-01 DIAGNOSIS — N760 Acute vaginitis: Secondary | ICD-10-CM | POA: Diagnosis not present

## 2019-10-01 DIAGNOSIS — Z118 Encounter for screening for other infectious and parasitic diseases: Secondary | ICD-10-CM | POA: Diagnosis not present

## 2019-10-01 DIAGNOSIS — Z113 Encounter for screening for infections with a predominantly sexual mode of transmission: Secondary | ICD-10-CM | POA: Diagnosis not present

## 2019-10-06 DIAGNOSIS — R87612 Low grade squamous intraepithelial lesion on cytologic smear of cervix (LGSIL): Secondary | ICD-10-CM | POA: Diagnosis not present

## 2019-10-17 ENCOUNTER — Other Ambulatory Visit: Payer: Self-pay | Admitting: Obstetrics and Gynecology

## 2019-10-21 ENCOUNTER — Encounter (HOSPITAL_COMMUNITY): Payer: Self-pay

## 2019-10-23 ENCOUNTER — Other Ambulatory Visit: Payer: Self-pay

## 2019-10-23 ENCOUNTER — Encounter (HOSPITAL_COMMUNITY)
Admission: RE | Admit: 2019-10-23 | Discharge: 2019-10-23 | Disposition: A | Discharge status: Home / Self Care | Payer: BC Managed Care – PPO | Source: Ambulatory Visit | Attending: Obstetrics and Gynecology | Admitting: Obstetrics and Gynecology

## 2019-10-23 ENCOUNTER — Encounter (HOSPITAL_COMMUNITY): Payer: Self-pay

## 2019-10-23 DIAGNOSIS — Z01812 Encounter for preprocedural laboratory examination: Secondary | ICD-10-CM | POA: Diagnosis not present

## 2019-10-23 HISTORY — DX: Other specified postprocedural states: Z98.890

## 2019-10-23 HISTORY — DX: Nausea with vomiting, unspecified: R11.2

## 2019-10-23 HISTORY — DX: Iron deficiency anemia, unspecified: D50.9

## 2019-10-23 HISTORY — DX: Obesity, unspecified: E66.9

## 2019-10-23 HISTORY — DX: Gastro-esophageal reflux disease without esophagitis: K21.9

## 2019-10-23 LAB — CBC
HCT: 39.9 % (ref 36.0–46.0)
Hemoglobin: 12.7 g/dL (ref 12.0–15.0)
MCH: 27.8 pg (ref 26.0–34.0)
MCHC: 31.8 g/dL (ref 30.0–36.0)
MCV: 87.3 fL (ref 80.0–100.0)
Platelets: 348 10*3/uL (ref 150–400)
RBC: 4.57 MIL/uL (ref 3.87–5.11)
RDW: 13.3 % (ref 11.5–15.5)
WBC: 8.6 10*3/uL (ref 4.0–10.5)
nRBC: 0 % (ref 0.0–0.2)

## 2019-10-23 LAB — HCG, SERUM, QUALITATIVE: Preg, Serum: NEGATIVE

## 2019-10-23 NOTE — Progress Notes (Signed)
PCP - C. Nafziger last office visit 08/29/19 in epic Cardiologist - N/A  Chest x-ray - N/A EKG - N/A Stress Test - N/A ECHO - N/A Cardiac Cath - N/A  Sleep Study - N/A CPAP - N/A  Fasting Blood Sugar - N/A Checks Blood Sugar __N/A___ times a day Hgb A1c 5.4 08/29/19 in epic  Blood Thinner InstructionsN/A Aspirin Instructions:N/A Last Dose:N/A  Anesthesia review: N/A  Patient denies shortness of breath, fever, cough and chest pain at PAT appointment   Patient verbalized understanding of instructions that were given to them at the PAT appointment. Patient was also instructed that they will need to review over the PAT instructions again at home before surgery.

## 2019-10-23 NOTE — Patient Instructions (Signed)
DUE TO COVID-19 ONLY ONE VISITOR IS ALLOWED IN WAITING ROOM (VISITOR WILL HAVE A TEMPERATURE CHECK ON ARRIVAL AND MUST WEAR A FACE MASK THE ENTIRE TIME.)  ONCE YOU ARE ADMITTED TO YOUR PRIVATE ROOM, THE SAME ONE VISITOR IS ALLOWED TO VISIT DURING VISITING HOURS ONLY.  Your COVID swab testing is scheduled for Oct 27, 2019 at 2:25PM , You must self quarantine after your testing per handout given to you at the testing site.  ([address], [facility] [place] [address] up testing enter pre-surgical testing line)    Your procedure is scheduled on: Thursday, Oct 30, 2019  Report to [facility] AT  8:45 A. M.   Call this number if you have problems the morning of surgery:  [phone].   OUR ADDRESS IS [address].  WE ARE LOCATED [place]                                   [place].                                     REMEMBER:  DO NOT EAT FOOD OR DRINK LIQUIDS AFTER MIDNIGHT .    BRUSH YOUR TEETH THE MORNING OF SURGERY.  TAKE THESE MEDICATIONS MORNING OF SURGERY WITH A SIP OF WATER:  NONE  DO NOT WEAR JEWERLY, MAKE UP, OR NAIL POLISH.  DO NOT WEAR LOTIONS, POWDERS, PERFUMES/COLOGNE OR DEODORANT.  DO NOT SHAVE FOR 24 HOURS PRIOR TO DAY OF SURGERY.  CONTACTS, GLASSES, OR DENTURES MAY NOT BE WORN TO SURGERY.                                    [facility] IS NOT RESPONSIBLE  FOR ANY BELONGINGS.          BRING ALL PRESCRIPTION MEDICATIONS WITH YOU THE DAY OF SURGERY IN ORIGINAL CONTAINERS                                                              .  Incentive Spirometer  An incentive spirometer is a tool that can help keep your lungs clear and active. This tool measures how well you are filling your lungs with each breath. Taking long deep breaths may help reverse or decrease the chance of developing breathing (pulmonary) problems (especially infection) following:  A long period of time when you are unable to move or be  active. BEFORE THE PROCEDURE   If the spirometer includes an indicator to show your best effort, your nurse or respiratory therapist will set it to a desired goal.  If possible, sit up straight or lean slightly forward. Try not to slouch.  Hold the incentive spirometer in an upright position. INSTRUCTIONS FOR USE  1. Sit on the edge of your bed if possible, or sit up as far as you can in bed or on a chair. 2. Hold the incentive spirometer in an upright position. 3. Breathe out normally. 4. Place the mouthpiece in your mouth and seal your lips tightly  around it. 5. Breathe in slowly and as deeply as possible, raising the piston or the ball toward the top of the column. 6. Hold your breath for 3-5 seconds or for as long as possible. Allow the piston or ball to fall to the bottom of the column. 7. Remove the mouthpiece from your mouth and breathe out normally. 8. Rest for a few seconds and repeat Steps 1 through 7 at least 10 times every 1-2 hours when you are awake. Take your time and take a few normal breaths between deep breaths. 9. The spirometer may include an indicator to show your best effort. Use the indicator as a goal to work toward during each repetition. 10. After each set of 10 deep breaths, practice coughing to be sure your lungs are clear. If you have an incision (the cut made at the time of surgery), support your incision when coughing by placing a pillow or rolled up towels firmly against it. Once you are able to get out of bed, walk around indoors and cough well. You may stop using the incentive spirometer when instructed by your caregiver.  RISKS AND COMPLICATIONS  Take your time so you do not get dizzy or light-headed.  If you are in pain, you may need to take or ask for pain medication before doing incentive spirometry. It is harder to take a deep breath if you are having pain. AFTER USE  Rest and breathe slowly and easily.  It can be helpful to keep track of a log of  your progress. Your caregiver can provide you with a simple table to help with this. If you are using the spirometer at home, follow these instructions: [place] IF:   You are having difficultly using the spirometer.  You have trouble using the spirometer as often as instructed.  Your pain medication is not giving enough relief while using the spirometer.  You develop fever of 100.5 F (38.1 C) or higher. SEEK IMMEDIATE MEDICAL CARE IF:   You cough up bloody sputum that had not been present before.  You develop fever of 102 F (38.9 C) or greater.  You develop worsening pain at or near the incision site. MAKE SURE YOU:   Understand these instructions.  Will watch your condition.  Will get help right away if you are not doing well or get worse. Document Released: 10/16/2006 Document Revised: 08/28/2011 Document Reviewed: 12/17/2006 ExitCare Patient Information 2014 ExitCare, [place].   ________________________________________________________________________  WHAT IS A BLOOD TRANSFUSION? Blood Transfusion Information  A transfusion is the replacement of blood or some of its parts. Blood is made up of multiple cells which provide different functions.  Red blood cells carry oxygen and are used for blood loss replacement.  White blood cells fight against infection.  Platelets control bleeding.  Plasma helps clot blood.  Other blood products are available for specialized needs, such as hemophilia or other clotting disorders. BEFORE THE TRANSFUSION  Who gives blood for transfusions?   Healthy volunteers who are fully evaluated to make sure their blood is safe. This is blood bank blood. Transfusion therapy is the safest it has ever been in the practice of medicine. Before blood is taken from a donor, a complete history is taken to make sure that person has no history of diseases nor engages in risky social behavior (examples are intravenous drug use or sexual activity  with multiple partners). The donor's travel history is screened to minimize risk of transmitting infections, such as malaria. The  donated blood is tested for signs of infectious diseases, such as HIV and hepatitis. The blood is then tested to be sure it is compatible with you in order to minimize the chance of a transfusion reaction. If you or a relative donates blood, this is often done in anticipation of surgery and is not appropriate for emergency situations. It takes many days to process the donated blood. RISKS AND COMPLICATIONS Although transfusion therapy is very safe and saves many lives, the main dangers of transfusion include:   Getting an infectious disease.  Developing a transfusion reaction. This is an allergic reaction to something in the blood you were given. Every precaution is taken to prevent this. The decision to have a blood transfusion has been considered carefully by your caregiver before blood is given. Blood is not given unless the benefits outweigh the risks. AFTER THE TRANSFUSION  Right after receiving a blood transfusion, you will usually feel much better and more energetic. This is especially true if your red blood cells have gotten low (anemic). The transfusion raises the level of the red blood cells which carry oxygen, and this usually causes an energy increase.  The nurse administering the transfusion will monitor you carefully for complications. HOME CARE INSTRUCTIONS  No special instructions are needed after a transfusion. You may find your energy is better. Speak with your caregiver about any limitations on activity for underlying diseases you may have. SEEK MEDICAL CARE IF:   Your condition is not improving after your transfusion.  You develop redness or irritation at the intravenous (IV) site. SEEK IMMEDIATE MEDICAL CARE IF:  Any of the following symptoms occur over the next 12 hours:  Shaking chills.  You have a temperature by mouth above 102 F (38.9  C), not controlled by medicine.  Chest, back, or muscle pain.  People around you feel you are not acting correctly or are confused.  Shortness of breath or difficulty breathing.  Dizziness and fainting.  You get a rash or develop hives.  You have a decrease in urine output.  Your urine turns a dark color or changes to pink, red, or brown. Any of the following symptoms occur over the next 10 days:  You have a temperature by mouth above 102 F (38.9 C), not controlled by medicine.  Shortness of breath.  Weakness after normal activity.  The white part of the eye turns yellow (jaundice).  You have a decrease in the amount of urine or are urinating less often.  Your urine turns a dark color or changes to pink, red, or brown. Document Released: 06/02/2000 Document Revised: 08/28/2011 Document Reviewed: 01/20/2008 [facility] Patient Information 2014 [place], [place].  _______________________________________________________________________

## 2019-10-24 LAB — ABO/RH: ABO/RH(D): B POS

## 2019-10-27 ENCOUNTER — Other Ambulatory Visit (HOSPITAL_COMMUNITY)
Admission: RE | Admit: 2019-10-27 | Discharge: 2019-10-27 | Disposition: A | Discharge status: Home / Self Care | Payer: BC Managed Care – PPO | Source: Ambulatory Visit | Attending: Obstetrics and Gynecology | Admitting: Obstetrics and Gynecology

## 2019-10-27 DIAGNOSIS — Z20822 Contact with and (suspected) exposure to covid-19: Secondary | ICD-10-CM | POA: Diagnosis not present

## 2019-10-27 DIAGNOSIS — Z01812 Encounter for preprocedural laboratory examination: Secondary | ICD-10-CM | POA: Diagnosis not present

## 2019-10-27 LAB — SARS CORONAVIRUS 2 (TAT 6-24 HRS): SARS Coronavirus 2: NEGATIVE

## 2019-10-29 NOTE — H&P (Signed)
Lori [name] is an 29 y.o. female. 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 History of TL.  Pertinent Gynecological History: Menses: flow is moderate Bleeding: dysfunctional uterine bleeding Contraception: tubal ligation DES exposure: denies Blood transfusions: none Sexually transmitted diseases: no past history Previous GYN Procedures: DNC  Last mammogram: na Date: na Last pap: lgsil Date: 2021 OB History: G3, P2   Menstrual History: Menarche age: 32 Patient's last menstrual period was 10/17/2019 (exact date).    Past Medical History:  Diagnosis Date  . Abnormal uterine bleeding   . GERD (gastroesophageal reflux disease)   . Iron (Fe) deficiency anemia   . Migraine   . Migraine with aura   . Obesity   . PONV (postoperative nausea and vomiting)     Past Surgical History:  Procedure Laterality Date  . DILATION AND CURETTAGE, DIAGNOSTIC / THERAPEUTIC  2015  . TUBAL LIGATION  2014  . WISDOM TOOTH EXTRACTION      Family History  Problem Relation Age of Onset  . Diabetes Mother   . Heart disease Mother   . Diabetes Father   . Kidney disease Father   . Asthma Sister   . Seizures Sister   . Asthma Daughter   . Von Willebrand disease Son   . Asthma Sister   . Diabetes Maternal Grandmother   . Heart disease Maternal Grandmother   . Diabetes Maternal Grandfather   . Diabetes Paternal Grandmother   . Lupus Paternal Grandmother   . Diabetes Paternal Grandfather     Social History:  reports that she has never smoked. She has never used smokeless tobacco. She reports previous alcohol use. She reports that she does not use drugs.  Allergies:  Allergies  Allergen Reactions  . Benylin Adult Formula [Dextromethorphan] Shortness Of Breath  . Amoxicillin Hives, Itching and Swelling  . Benadryl [Diphenhydramine Hcl] Hives  . Codeine Itching  . Tdap [Tetanus-Diphth-Acell Pertussis]   . Tramadol Itching    No medications prior to admission.    Review of  Systems  Constitutional: Negative.   All other systems reviewed and are negative.   Last menstrual period 10/17/2019. Physical Exam  Nursing note and vitals reviewed. Constitutional: She is oriented to person, place, and time. She appears well-developed and well-nourished.  HENT:  Head: Normocephalic and atraumatic.  Cardiovascular: Normal rate and regular rhythm.  Respiratory: Effort normal and breath sounds normal.  GI: Soft. Bowel sounds are normal.  Genitourinary:    Vagina and uterus normal.   Musculoskeletal:        General: Normal range of motion.     Cervical back: Normal range of motion and neck supple.  Neurological: She is alert and oriented to person, place, and time. She has normal reflexes.  Skin: Skin is warm and dry.  Psychiatric: She has a normal mood and affect.    No results found for this or any previous visit (from the past 24 hour(s)).  No results found.  Assessment/Plan: AUB- refractory History of TL Davinci TLH, bilateral salpingectomy Surgical risks discussed. Consent done.  Lori [name] 10/29/2019, 8:49 PM

## 2019-10-29 NOTE — Anesthesia Preprocedure Evaluation (Addendum)
Anesthesia Evaluation  Patient identified by MRN, date of birth, ID band Patient awake    Reviewed: Allergy & Precautions, NPO status , Patient's Chart, lab work & pertinent test results  History of Anesthesia Complications (+) PONV  Airway Mallampati: II  TM Distance: >3 FB Neck ROM: Full    Dental no notable dental hx. (+) Teeth Intact, Dental Advisory Given   Pulmonary neg pulmonary ROS,    Pulmonary exam normal breath sounds clear to auscultation       Cardiovascular Normal cardiovascular exam Rhythm:Regular Rate:Normal     Neuro/Psych negative neurological ROS     GI/Hepatic Neg liver ROS, GERD  ,  Endo/Other  negative endocrine ROS  Renal/GU negative Renal ROS     Musculoskeletal negative musculoskeletal ROS (+)   Abdominal (+) + obese,   Peds  Hematology Hgb 12.7    Anesthesia Other Findings   Reproductive/Obstetrics negative OB ROS                           Anesthesia Physical Anesthesia Plan  ASA: III  Anesthesia Plan: General   Post-op Pain Management:    Induction: Intravenous  PONV Risk Score and Plan: 4 or greater and Treatment may vary due to age or medical condition, Ondansetron, Dexamethasone, Midazolam and Scopolamine patch - Pre-op  Airway Management Planned: Oral ETT  Additional Equipment: None  Intra-op Plan:   Post-operative Plan: Extubation in OR  Informed Consent: I have reviewed the patients History and Physical, chart, labs and discussed the procedure including the risks, benefits and alternatives for the proposed anesthesia with the patient or authorized representative who has indicated his/her understanding and acceptance.     Dental advisory given  Plan Discussed with: CRNA  Anesthesia Plan Comments: (Ga Plus lidocaine plus dexmetdetomidine 0.3mg /kg )       Anesthesia Quick Evaluation

## 2019-10-30 ENCOUNTER — Ambulatory Visit (HOSPITAL_BASED_OUTPATIENT_CLINIC_OR_DEPARTMENT_OTHER): Payer: BC Managed Care – PPO | Admitting: Anesthesiology

## 2019-10-30 ENCOUNTER — Encounter (HOSPITAL_BASED_OUTPATIENT_CLINIC_OR_DEPARTMENT_OTHER)
Admission: RE | Disposition: A | Discharge status: Home / Self Care | Payer: Self-pay | Source: Ambulatory Visit | Attending: Obstetrics and Gynecology

## 2019-10-30 ENCOUNTER — Ambulatory Visit (HOSPITAL_BASED_OUTPATIENT_CLINIC_OR_DEPARTMENT_OTHER)
Admission: RE | Admit: 2019-10-30 | Discharge: 2019-10-31 | Disposition: A | Discharge status: Home / Self Care | Payer: BC Managed Care – PPO | Source: Ambulatory Visit | Attending: Obstetrics and Gynecology | Admitting: Obstetrics and Gynecology

## 2019-10-30 ENCOUNTER — Encounter (HOSPITAL_BASED_OUTPATIENT_CLINIC_OR_DEPARTMENT_OTHER): Payer: Self-pay | Admitting: Obstetrics and Gynecology

## 2019-10-30 DIAGNOSIS — Z8489 Family history of other specified conditions: Secondary | ICD-10-CM | POA: Diagnosis not present

## 2019-10-30 DIAGNOSIS — N921 Excessive and frequent menstruation with irregular cycle: Secondary | ICD-10-CM | POA: Insufficient documentation

## 2019-10-30 DIAGNOSIS — N920 Excessive and frequent menstruation with regular cycle: Secondary | ICD-10-CM | POA: Diagnosis not present

## 2019-10-30 DIAGNOSIS — Z885 Allergy status to narcotic agent status: Secondary | ICD-10-CM | POA: Insufficient documentation

## 2019-10-30 DIAGNOSIS — N926 Irregular menstruation, unspecified: Secondary | ICD-10-CM | POA: Diagnosis not present

## 2019-10-30 DIAGNOSIS — G43909 Migraine, unspecified, not intractable, without status migrainosus: Secondary | ICD-10-CM | POA: Insufficient documentation

## 2019-10-30 DIAGNOSIS — Z841 Family history of disorders of kidney and ureter: Secondary | ICD-10-CM | POA: Diagnosis not present

## 2019-10-30 DIAGNOSIS — Z820 Family history of epilepsy and other diseases of the nervous system: Secondary | ICD-10-CM | POA: Diagnosis not present

## 2019-10-30 DIAGNOSIS — Z887 Allergy status to serum and vaccine status: Secondary | ICD-10-CM | POA: Insufficient documentation

## 2019-10-30 DIAGNOSIS — Z888 Allergy status to other drugs, medicaments and biological substances status: Secondary | ICD-10-CM | POA: Insufficient documentation

## 2019-10-30 DIAGNOSIS — Z825 Family history of asthma and other chronic lower respiratory diseases: Secondary | ICD-10-CM | POA: Diagnosis not present

## 2019-10-30 DIAGNOSIS — K219 Gastro-esophageal reflux disease without esophagitis: Secondary | ICD-10-CM | POA: Insufficient documentation

## 2019-10-30 DIAGNOSIS — D509 Iron deficiency anemia, unspecified: Secondary | ICD-10-CM | POA: Diagnosis not present

## 2019-10-30 DIAGNOSIS — Z823 Family history of stroke: Secondary | ICD-10-CM | POA: Insufficient documentation

## 2019-10-30 DIAGNOSIS — N736 Female pelvic peritoneal adhesions (postinfective): Secondary | ICD-10-CM | POA: Insufficient documentation

## 2019-10-30 DIAGNOSIS — E669 Obesity, unspecified: Secondary | ICD-10-CM | POA: Insufficient documentation

## 2019-10-30 DIAGNOSIS — N800 Endometriosis of uterus: Secondary | ICD-10-CM | POA: Diagnosis not present

## 2019-10-30 DIAGNOSIS — K469 Unspecified abdominal hernia without obstruction or gangrene: Secondary | ICD-10-CM | POA: Diagnosis not present

## 2019-10-30 DIAGNOSIS — N939 Abnormal uterine and vaginal bleeding, unspecified: Secondary | ICD-10-CM | POA: Diagnosis present

## 2019-10-30 DIAGNOSIS — Z6841 Body Mass Index (BMI) 40.0 and over, adult: Secondary | ICD-10-CM | POA: Diagnosis not present

## 2019-10-30 DIAGNOSIS — Z9851 Tubal ligation status: Secondary | ICD-10-CM | POA: Insufficient documentation

## 2019-10-30 DIAGNOSIS — R87612 Low grade squamous intraepithelial lesion on cytologic smear of cervix (LGSIL): Secondary | ICD-10-CM | POA: Diagnosis not present

## 2019-10-30 DIAGNOSIS — Z8249 Family history of ischemic heart disease and other diseases of the circulatory system: Secondary | ICD-10-CM | POA: Insufficient documentation

## 2019-10-30 DIAGNOSIS — N870 Mild cervical dysplasia: Secondary | ICD-10-CM | POA: Diagnosis not present

## 2019-10-30 DIAGNOSIS — Z880 Allergy status to penicillin: Secondary | ICD-10-CM | POA: Diagnosis not present

## 2019-10-30 DIAGNOSIS — Z833 Family history of diabetes mellitus: Secondary | ICD-10-CM | POA: Diagnosis not present

## 2019-10-30 HISTORY — PX: ROBOTIC ASSISTED LAPAROSCOPIC HYSTERECTOMY AND SALPINGECTOMY: SHX6379

## 2019-10-30 LAB — TYPE AND SCREEN
ABO/RH(D): B POS
Antibody Screen: NEGATIVE

## 2019-10-30 LAB — POCT PREGNANCY, URINE: Preg Test, Ur: NEGATIVE

## 2019-10-30 SURGERY — XI ROBOTIC ASSISTED LAPAROSCOPIC HYSTERECTOMY AND SALPINGECTOMY
Anesthesia: General | Site: Abdomen | Laterality: Bilateral

## 2019-10-30 MED ORDER — FENTANYL CITRATE (PF) 100 MCG/2ML IJ SOLN
INTRAMUSCULAR | Status: AC
  Filled 2019-10-30: qty 2

## 2019-10-30 MED ORDER — DEXAMETHASONE SODIUM PHOSPHATE 10 MG/ML IJ SOLN
INTRAMUSCULAR | Status: DC | PRN
  Administered 2019-10-30 (×2): 5 mg via INTRAVENOUS

## 2019-10-30 MED ORDER — LACTATED RINGERS IV SOLN: INTRAVENOUS | Status: DC

## 2019-10-30 MED ORDER — FENTANYL CITRATE (PF) 100 MCG/2ML IJ SOLN
25.0000 ug | INTRAMUSCULAR | Status: DC | PRN
  Administered 2019-10-30 (×2): 50 ug via INTRAVENOUS

## 2019-10-30 MED ORDER — ONDANSETRON HCL 4 MG/2ML IJ SOLN: 4.0000 mg | Freq: Four times a day (QID) | INTRAMUSCULAR | Status: DC | PRN

## 2019-10-30 MED ORDER — IBUPROFEN 800 MG PO TABS: 800.0000 mg | ORAL_TABLET | Freq: Four times a day (QID) | ORAL | Status: DC

## 2019-10-30 MED ORDER — ONDANSETRON HCL 4 MG/2ML IJ SOLN: 4.0000 mg | Freq: Once | INTRAMUSCULAR | Status: DC | PRN

## 2019-10-30 MED ORDER — OXYCODONE-ACETAMINOPHEN 5-325 MG PO TABS
1.0000 | ORAL_TABLET | ORAL | Status: DC | PRN
  Administered 2019-10-30: 1 via ORAL

## 2019-10-30 MED ORDER — LIDOCAINE 2% (20 MG/ML) 5 ML SYRINGE
INTRAMUSCULAR | Status: AC
  Filled 2019-10-30: qty 5

## 2019-10-30 MED ORDER — ONDANSETRON HCL 4 MG/2ML IJ SOLN
INTRAMUSCULAR | Status: DC | PRN
  Administered 2019-10-30: 4 mg via INTRAVENOUS

## 2019-10-30 MED ORDER — LIDOCAINE 2% (20 MG/ML) 5 ML SYRINGE
INTRAMUSCULAR | Status: DC | PRN
Start: 2019-10-30 — End: 2019-10-30
  Administered 2019-10-30: 1.5 mg/kg/h via INTRAVENOUS

## 2019-10-30 MED ORDER — FENTANYL CITRATE (PF) 100 MCG/2ML IJ SOLN
INTRAMUSCULAR | Status: DC | PRN
  Administered 2019-10-30 (×4): 50 ug via INTRAVENOUS

## 2019-10-30 MED ORDER — ACETAMINOPHEN 500 MG PO TABS
1000.0000 mg | ORAL_TABLET | Freq: Once | ORAL | Status: AC
  Administered 2019-10-30: 1000 mg via ORAL

## 2019-10-30 MED ORDER — DEXMEDETOMIDINE HCL 200 MCG/2ML IV SOLN
INTRAVENOUS | Status: DC | PRN
  Administered 2019-10-30 (×2): 6 ug via INTRAVENOUS
  Administered 2019-10-30: 10 ug via INTRAVENOUS
  Administered 2019-10-30: 4 ug via INTRAVENOUS
  Administered 2019-10-30: 10 ug via INTRAVENOUS

## 2019-10-30 MED ORDER — MIDAZOLAM HCL 2 MG/2ML IJ SOLN
INTRAMUSCULAR | Status: AC
  Filled 2019-10-30: qty 2

## 2019-10-30 MED ORDER — LIDOCAINE 2% (20 MG/ML) 5 ML SYRINGE
INTRAMUSCULAR | Status: DC | PRN
  Administered 2019-10-30: 100 mg via INTRAVENOUS

## 2019-10-30 MED ORDER — ROCURONIUM BROMIDE 10 MG/ML (PF) SYRINGE
PREFILLED_SYRINGE | INTRAVENOUS | Status: DC | PRN
  Administered 2019-10-30: 70 mg via INTRAVENOUS

## 2019-10-30 MED ORDER — KETOROLAC TROMETHAMINE 30 MG/ML IJ SOLN
INTRAMUSCULAR | Status: DC | PRN
  Administered 2019-10-30: 30 mg via INTRAVENOUS

## 2019-10-30 MED ORDER — ACETAMINOPHEN 500 MG PO TABS
ORAL_TABLET | ORAL | Status: AC
  Filled 2019-10-30: qty 2

## 2019-10-30 MED ORDER — SCOPOLAMINE 1 MG/3DAYS TD PT72
1.0000 | MEDICATED_PATCH | TRANSDERMAL | Status: DC
  Administered 2019-10-30: 1.5 mg via TRANSDERMAL

## 2019-10-30 MED ORDER — MIDAZOLAM HCL 2 MG/2ML IJ SOLN
INTRAMUSCULAR | Status: DC | PRN
  Administered 2019-10-30: 2 mg via INTRAVENOUS

## 2019-10-30 MED ORDER — ROCURONIUM BROMIDE 10 MG/ML (PF) SYRINGE
PREFILLED_SYRINGE | INTRAVENOUS | Status: AC
  Filled 2019-10-30: qty 10

## 2019-10-30 MED ORDER — KETOROLAC TROMETHAMINE 30 MG/ML IJ SOLN
30.0000 mg | Freq: Four times a day (QID) | INTRAMUSCULAR | Status: DC
  Administered 2019-10-30 – 2019-10-31 (×3): 30 mg via INTRAVENOUS

## 2019-10-30 MED ORDER — PROPOFOL 10 MG/ML IV BOLUS
INTRAVENOUS | Status: DC | PRN
  Administered 2019-10-30: 200 mg via INTRAVENOUS

## 2019-10-30 MED ORDER — PROPOFOL 10 MG/ML IV BOLUS
INTRAVENOUS | Status: AC
  Filled 2019-10-30: qty 20

## 2019-10-30 MED ORDER — KETOROLAC TROMETHAMINE 30 MG/ML IJ SOLN: 30.0000 mg | Freq: Once | INTRAMUSCULAR | Status: AC | PRN

## 2019-10-30 MED ORDER — SODIUM CHLORIDE 0.9 % IV SOLN
INTRAVENOUS | Status: DC | PRN
  Administered 2019-10-30: 60 mL

## 2019-10-30 MED ORDER — SCOPOLAMINE 1 MG/3DAYS TD PT72
MEDICATED_PATCH | TRANSDERMAL | Status: AC
  Filled 2019-10-30: qty 1

## 2019-10-30 MED ORDER — BUPIVACAINE HCL (PF) 0.25 % IJ SOLN
INTRAMUSCULAR | Status: DC | PRN
  Administered 2019-10-30: 30 mL

## 2019-10-30 MED ORDER — SUGAMMADEX SODIUM 200 MG/2ML IV SOLN
INTRAVENOUS | Status: DC | PRN
  Administered 2019-10-30: 200 mg via INTRAVENOUS

## 2019-10-30 MED ORDER — KETOROLAC TROMETHAMINE 30 MG/ML IJ SOLN
INTRAMUSCULAR | Status: AC
  Filled 2019-10-30: qty 1

## 2019-10-30 MED ORDER — CEFAZOLIN SODIUM-DEXTROSE 2-4 GM/100ML-% IV SOLN
2.0000 g | INTRAVENOUS | Status: AC
  Administered 2019-10-30: 2 g via INTRAVENOUS

## 2019-10-30 MED ORDER — OXYCODONE HCL 5 MG/5ML PO SOLN: 5.0000 mg | Freq: Once | ORAL | Status: DC | PRN

## 2019-10-30 MED ORDER — OXYCODONE HCL 5 MG PO TABS: 5.0000 mg | ORAL_TABLET | Freq: Once | ORAL | Status: DC | PRN

## 2019-10-30 MED ORDER — CEFAZOLIN SODIUM-DEXTROSE 2-4 GM/100ML-% IV SOLN
INTRAVENOUS | Status: AC
  Filled 2019-10-30: qty 100

## 2019-10-30 MED ORDER — OXYCODONE-ACETAMINOPHEN 5-325 MG PO TABS
ORAL_TABLET | ORAL | Status: AC
  Filled 2019-10-30: qty 1

## 2019-10-30 SURGICAL SUPPLY — 58 items
BARRIER ADHS 3X4 INTERCEED (GAUZE/BANDAGES/DRESSINGS) IMPLANT
CANISTER SUCT 3000ML PPV (MISCELLANEOUS) ×2 IMPLANT
CATH FOLEY 3WAY  5CC 16FR (CATHETERS) ×1
CATH FOLEY 3WAY 5CC 16FR (CATHETERS) ×1 IMPLANT
COVER BACK TABLE 60X90IN (DRAPES) ×2 IMPLANT
COVER TIP SHEARS 8 DVNC (MISCELLANEOUS) ×1 IMPLANT
COVER TIP SHEARS 8MM DA VINCI (MISCELLANEOUS) ×1
COVER WAND RF STERILE (DRAPES) ×2 IMPLANT
DECANTER SPIKE VIAL GLASS SM (MISCELLANEOUS) ×4 IMPLANT
DEFOGGER SCOPE WARMER CLEARIFY (MISCELLANEOUS) ×2 IMPLANT
DERMABOND ADVANCED (GAUZE/BANDAGES/DRESSINGS) ×1
DERMABOND ADVANCED .7 DNX12 (GAUZE/BANDAGES/DRESSINGS) ×1 IMPLANT
DRAPE ARM DVNC X/XI (DISPOSABLE) ×4 IMPLANT
DRAPE COLUMN DVNC XI (DISPOSABLE) ×1 IMPLANT
DRAPE DA VINCI XI ARM (DISPOSABLE) ×4
DRAPE DA VINCI XI COLUMN (DISPOSABLE) ×1
DURAPREP 26ML APPLICATOR (WOUND CARE) ×2 IMPLANT
ELECT REM PT RETURN 9FT ADLT (ELECTROSURGICAL) ×2
ELECTRODE REM PT RTRN 9FT ADLT (ELECTROSURGICAL) ×1 IMPLANT
GAUZE 4X4 16PLY RFD (DISPOSABLE) ×2 IMPLANT
GLOVE BIO SURGEON STRL SZ7 (GLOVE) IMPLANT
GLOVE BIO SURGEON STRL SZ7.5 (GLOVE) ×6 IMPLANT
GLOVE BIOGEL PI IND STRL 7.0 (GLOVE) IMPLANT
GLOVE BIOGEL PI INDICATOR 7.0 (GLOVE)
IRRIG SUCT STRYKERFLOW 2 WTIP (MISCELLANEOUS) ×2
IRRIGATION SUCT STRKRFLW 2 WTP (MISCELLANEOUS) ×1 IMPLANT
NEEDLE INSUFFLATION 150MM (ENDOMECHANICALS) ×2 IMPLANT
OBTURATOR OPTICAL STANDARD 8MM (TROCAR) ×1
OBTURATOR OPTICAL STND 8 DVNC (TROCAR) ×1
OBTURATOR OPTICALSTD 8 DVNC (TROCAR) ×1 IMPLANT
OCCLUDER COLPOPNEUMO (BALLOONS) ×2 IMPLANT
PACK ROBOT WH (CUSTOM PROCEDURE TRAY) ×2 IMPLANT
PACK ROBOTIC GOWN (GOWN DISPOSABLE) ×2 IMPLANT
PACK TRENDGUARD 450 HYBRID PRO (MISCELLANEOUS) IMPLANT
PACK TRENDGUARD 600 HYBRD PROC (MISCELLANEOUS) IMPLANT
PAD PREP 24X48 CUFFED NSTRL (MISCELLANEOUS) ×2 IMPLANT
PROTECTOR NERVE ULNAR (MISCELLANEOUS) ×4 IMPLANT
SEAL CANN UNIV 5-8 DVNC XI (MISCELLANEOUS) ×3 IMPLANT
SEAL XI 5MM-8MM UNIVERSAL (MISCELLANEOUS) ×3
SET IRRIG Y TYPE TUR BLADDER L (SET/KITS/TRAYS/PACK) IMPLANT
SET TRI-LUMEN FLTR TB AIRSEAL (TUBING) ×2 IMPLANT
SPONGE LAP 4X18 RFD (DISPOSABLE) IMPLANT
SUT VIC AB 0 CT1 27 (SUTURE) ×1
SUT VIC AB 0 CT1 27XBRD ANBCTR (SUTURE) ×1 IMPLANT
SUT VICRYL 0 UR6 27IN ABS (SUTURE) ×2 IMPLANT
SUT VICRYL RAPIDE 4/0 PS 2 (SUTURE) ×5 IMPLANT
SUT VLOC 180 0 9IN  GS21 (SUTURE) ×1
SUT VLOC 180 0 9IN GS21 (SUTURE) ×1 IMPLANT
TIP RUMI ORANGE 6.7MMX12CM (TIP) IMPLANT
TIP UTERINE 5.1X6CM LAV DISP (MISCELLANEOUS) IMPLANT
TIP UTERINE 6.7X10CM GRN DISP (MISCELLANEOUS) IMPLANT
TIP UTERINE 6.7X6CM WHT DISP (MISCELLANEOUS) IMPLANT
TIP UTERINE 6.7X8CM BLUE DISP (MISCELLANEOUS) ×1 IMPLANT
TOWEL OR 17X26 10 PK STRL BLUE (TOWEL DISPOSABLE) ×2 IMPLANT
TRENDGUARD 450 HYBRID PRO PACK (MISCELLANEOUS)
TRENDGUARD 600 HYBRID PROC PK (MISCELLANEOUS) ×2
TROCAR PORT AIRSEAL 8X120 (TROCAR) ×2 IMPLANT
WATER STERILE IRR 1000ML POUR (IV SOLUTION) ×2 IMPLANT

## 2019-10-30 NOTE — Transfer of Care (Signed)
Immediate Anesthesia Transfer of Care Note  Patient: Lori [name]  Procedure(s) Performed: Procedure(s) (LRB): XI ROBOTIC ASSISTED LAPAROSCOPIC HYSTERECTOMY AND SALPINGECTOMY (Bilateral)  Patient Location: PACU  Anesthesia Type: General  Level of Consciousness: awake, oriented, sedated and patient cooperative  Airway & Oxygen Therapy: Patient Spontanous Breathing and Patient connected to face mask oxygen  Post-op Assessment: Report given to PACU RN and Post -op Vital signs reviewed and stable  Post vital signs: Reviewed and stable  Complications: No apparent anesthesia complications Last Vitals:  Vitals Value Taken Time  BP 147/87 10/30/19 1229  Temp    Pulse 89 10/30/19 1230  Resp 15 10/30/19 1230  SpO2 100 % 10/30/19 1230  Vitals shown include unvalidated device data.  Last Pain:  Vitals:   10/30/19 0956  TempSrc: Oral  PainSc: 0-No pain      Patients Stated Pain Goal: 5 (10/30/19 0956)

## 2019-10-30 NOTE — Op Note (Signed)
10/30/2019  12:08 PM  PATIENT:  Lori [name]  29 y.o. female  PRE-OPERATIVE DIAGNOSIS:  Refractory Menorrhagia Failed EAB and hormonal therapy History of TL LGSIL  POST-OPERATIVE DIAGNOSIS:  Refractory Menorrhagia Same Left adnexal adhesions Enterocele   PROCEDURE:  Procedure(s): XI ROBOTIC ASSISTED LAPAROSCOPIC HYSTERECTOMY  BILATERAL  SALPINGECTOMY LYSIS OF LEFT ADNEXAL ADHESIONS TO SIGMOID COLON MCCALL CUL DE PLASTY  SURGEON:  Surgeon(s): Olivia Mackie, MD  ASSISTANTSKennith Center, RNFA   ANESTHESIA:   local and general  ESTIMATED BLOOD LOSS: 20 mL   DRAINS: Urinary Catheter (Foley)   LOCAL MEDICATIONS USED:  MARCAINE    and Amount: 30 ml  SPECIMEN:  Source of Specimen:  UTERUS , CERVIX , TUBAL SEGMENTS  DISPOSITION OF SPECIMEN:  PATHOLOGY  COUNTS:  YES  DICTATION #: [id number]  PLAN OF CARE: DC HOME  PATIENT DISPOSITION:  PACU - hemodynamically stable.

## 2019-10-30 NOTE — Op Note (Signed)
NAME: Lori, [name] [facility] MEDICAL RECORD ZH:[id number] ACCOUNT [id number] DATE OF BIRTH:February 03, 1991 FACILITY: [facility] LOCATION: [facility] PHYSICIAN:Samay Delcarlo J. Billy Coast, MD  OPERATIVE REPORT  DATE OF PROCEDURE:  10/30/2019  PREOPERATIVE DIAGNOSES:  Refractory menometrorrhagia.  History of failed tubal ablation and hormonal therapy.  History of tubal ligation.  Low grade squamous intraepithelial lesion on Pap smear.  POSTOPERATIVE DIAGNOSES:  Refractory menometrorrhagia.  History of failed tubal ablation and hormonal therapy.  History of tubal ligation.  Low grade squamous intraepithelial lesion on Pap smear.  Left adnexal and sigmoid adhesions and enterocele.  PROCEDURE:  Da Vinci-assisted total laparoscopic hysterectomy, bilateral salpingectomy, lysis of left adnexal adhesions to sigmoid colon, McCall culdoplasty.  SURGEON:  Olivia Mackie, MD  ASSISTANT:  Herma Mering, RNFA  ESTIMATED BLOOD LOSS:  50 mL.  COMPLICATIONS:  None.  DRAINS:  Foley.  COUNTS:  Correct.  DISPOSITION:  The patient to recovery in good condition.  BRIEF OPERATIVE NOTE:  After being apprised of the risks of anesthesia, infection, bleeding, and surrounding organs, possible need for repair, delayed versus immediate complications including bowel and bladder, internal vessel injury, possible need for  repair, patient was brought to the operating room where she was administered general anesthetic without complications.  Prepped and draped in usual sterile fashion.  Feet are placed in Yellofin stirrups.  Exam under anesthesia reveals a retroflexed bulky  uterus.  No adnexal masses.  RUMI retractor and Foley catheter were placed without difficulty.  Infraumbilical incision made with a scalpel.  Veress needle placed, opening pressure -2.  Four liters CO2 insufflated without difficulty.  Visualization  reveals atraumatic trocar entry.  Normal liver, gallbladder bed, normal appendiceal area, bulky uterus.  Left adnexal  adhesions.  Previously divided tubes.  At this time, 2 robotic ports made, 1 robotic port on the right, 1 robotic port on the left and  an AirSeal port on the left are all established under direct visualization.  Deep Trendelenburg position, having been established, robot was then docked in standard fashion.  Bipolar fenestrated forceps and EndoShears were placed.  Left adnexa is  addressed.  The distal segment of the left tube previously divided is undermined along the mesosalpinx, cauterized using monopolar and bipolar cautery and removed.  The same was done on the right side of the distal segment of tube appears normal, was  also removed.  The retroperitoneal space was then entered on the left.  The tubo-ovarian ligament skeletonized on the left, cauterized and divided using monopolar cautery.  The ureter was identified along the medial leaf of the left perineum was sharply  dissected off the medial leaf and pushed distally.  The round ligament was opened on the left using monopolar and bipolar cautery.  The uterine vessels on the left are skeletonized.  The bladder flap was sharply developed and off the RUMI cup in a sharp  and blunt dissection fashion.  The left uterine vessels were skeletonized, cauterized using bipolar cautery and divided.  On the right side, the retroperitoneal space was entered.  Ureter was identified.  The tubo-ovarian ligament is skeletonized,  cauterized using monopolar and bipolar cautery.  The round ligament was opened using monopolar and bipolar cautery and divided on the right.  The right uterine vessels were skeletonized using sharp and blunt dissection.  A bladder flap was further  developed using sharp and blunt dissection.  The right uterine vessels were skeletonized, cauterized and divided using monopolar cautery.  Good hemostasis was noted.  Bladder flap was further established.  Bulging of  the RUMI cup was noted to be at the  cervicovaginal junction and a  circumferential incision was made detaching the specimen, which was easily removed through the vagina.  At this time, the vaginal incision was closed in a running imbricating layer of 0 V-Loc suture, 2nd layer was placed.   McCall culdoplasty suture placed in the standard fashion.  Good hemostasis was noted.  Ureters were seen peristalsing bilaterally.  Irrigation accomplished.  Urine is clear.  CO2 was released.  All instruments were removed and the robot was undocked.   Incisions are closed using 0 Vicryl, 4-0 Vicryl.  Dilute Marcaine solution was placed.  Vaginal ropivacaine solution is placed prior to removal of the camera, note.  At this time, vaginal exam reveals a well-approximated vaginal cuff.  No evidence of  dehiscence.  Urine was clear.  The patient tolerated the procedure well, was awakened and transferred to recovery in good condition.  CN/NUANCE  D:10/30/2019 T:10/30/2019 JOB:[id number]

## 2019-10-30 NOTE — Anesthesia Postprocedure Evaluation (Signed)
Anesthesia Post Note  Patient: Lori [name]  Procedure(s) Performed: XI ROBOTIC ASSISTED LAPAROSCOPIC HYSTERECTOMY AND SALPINGECTOMY (Bilateral Abdomen)     Patient location during evaluation: PACU Anesthesia Type: General Level of consciousness: awake and alert Pain management: pain level controlled Vital Signs Assessment: post-procedure vital signs reviewed and stable Respiratory status: spontaneous breathing, nonlabored ventilation, respiratory function stable and patient connected to nasal cannula oxygen Cardiovascular status: blood pressure returned to baseline and stable Postop Assessment: no apparent nausea or vomiting Anesthetic complications: no    Last Vitals:  Vitals:   10/30/19 1315 10/30/19 1348  BP: (!) 142/90 (!) 142/86  Pulse: 79 76  Resp: 11 12  Temp:  (!) 36.4 C  SpO2: 100% 100%    Last Pain:  Vitals:   10/30/19 1300  TempSrc:   PainSc: 3                  Trevor Iha

## 2019-10-30 NOTE — Progress Notes (Signed)
POD 1 Procedure(s) (LRB): XI ROBOTIC ASSISTED LAPAROSCOPIC HYSTERECTOMY AND SALPINGECTOMY (Bilateral)  Subjective: Patient reports nausea, incisional pain, tolerating PO, + flatus and no problems voiding.    Objective: BP 131/70 (BP Location: Left Arm)   Pulse (!) 105   Temp 98.4 F (36.9 C)   Resp 16   Ht 5\' 4"  (1.626 m)   Wt 130.5 kg   LMP 10/17/2019 (Exact Date)   SpO2 99%   BMI 49.37 kg/m   CBC    Component Value Date/Time   WBC 14.1 (H) 10/31/2019 0523   RBC 4.26 10/31/2019 0523   HGB 11.9 (L) 10/31/2019 0523   HGB 11.9 07/27/2017 1703   HCT 37.9 10/31/2019 0523   HCT 35.9 07/27/2017 1703   PLT 302 10/31/2019 0523   PLT 385 (H) 07/27/2017 1703   MCV 89.0 10/31/2019 0523   MCV 84 07/27/2017 1703   MCH 27.9 10/31/2019 0523   MCHC 31.4 10/31/2019 0523   RDW 13.9 10/31/2019 0523   RDW 13.6 07/27/2017 1703   LYMPHSABS 1.9 08/29/2019 1112   MONOABS 0.4 08/29/2019 1112   EOSABS 0.4 08/29/2019 1112   BASOSABS 0.0 08/29/2019 1112    I have reviewed patient's vital signs, intake and output, medications and labs.  General: alert, cooperative and appears stated age Resp: clear to auscultation bilaterally and normal percussion bilaterally Cardio: regular rate and rhythm, S1, S2 normal, no murmur, click, rub or gallop GI: soft, non-tender; bowel sounds normal; no masses,  no organomegaly and incision: clean, dry and intact Extremities: extremities normal, atraumatic, no cyanosis or edema and Homans sign is negative, no sign of DVT Vaginal Bleeding: minimal  Assessment: s/p Procedure(s) with comments: XI ROBOTIC ASSISTED LAPAROSCOPIC HYSTERECTOMY AND SALPINGECTOMY (Bilateral) - Tracie RNFA confirmed 10/13/19 CS: stable and progressing well  Plan: Advance diet Encourage ambulation Advance to PO medication Discharge home  LOS: 0 days    Pauline Trainer J 10/30/2019, 9:40 PM

## 2019-10-30 NOTE — Anesthesia Procedure Notes (Signed)
Procedure Name: Intubation Date/Time: 10/30/2019 10:51 AM Performed by: Suan Halter, CRNA Pre-anesthesia Checklist: Patient identified, Emergency Drugs available, Suction available and Patient being monitored Patient Re-evaluated:Patient Re-evaluated prior to induction Oxygen Delivery Method: Circle system utilized Preoxygenation: Pre-oxygenation with 100% oxygen Induction Type: IV induction Ventilation: Mask ventilation without difficulty Laryngoscope Size: Mac and 3 Grade View: Grade I Tube type: Oral Tube size: 7.0 mm Number of attempts: 1 Airway Equipment and Method: Stylet and Oral airway Placement Confirmation: ETT inserted through vocal cords under direct vision,  positive ETCO2 and breath sounds checked- equal and bilateral Secured at: 22 cm Tube secured with: Tape Dental Injury: Teeth and Oropharynx as per pre-operative assessment

## 2019-10-31 DIAGNOSIS — Z885 Allergy status to narcotic agent status: Secondary | ICD-10-CM | POA: Diagnosis not present

## 2019-10-31 DIAGNOSIS — N736 Female pelvic peritoneal adhesions (postinfective): Secondary | ICD-10-CM | POA: Diagnosis not present

## 2019-10-31 DIAGNOSIS — E669 Obesity, unspecified: Secondary | ICD-10-CM | POA: Diagnosis not present

## 2019-10-31 DIAGNOSIS — Z887 Allergy status to serum and vaccine status: Secondary | ICD-10-CM | POA: Diagnosis not present

## 2019-10-31 DIAGNOSIS — K219 Gastro-esophageal reflux disease without esophagitis: Secondary | ICD-10-CM | POA: Diagnosis not present

## 2019-10-31 DIAGNOSIS — R87612 Low grade squamous intraepithelial lesion on cytologic smear of cervix (LGSIL): Secondary | ICD-10-CM | POA: Diagnosis not present

## 2019-10-31 DIAGNOSIS — N921 Excessive and frequent menstruation with irregular cycle: Secondary | ICD-10-CM | POA: Diagnosis not present

## 2019-10-31 DIAGNOSIS — Z888 Allergy status to other drugs, medicaments and biological substances status: Secondary | ICD-10-CM | POA: Diagnosis not present

## 2019-10-31 DIAGNOSIS — N800 Endometriosis of uterus: Secondary | ICD-10-CM | POA: Diagnosis not present

## 2019-10-31 DIAGNOSIS — G43909 Migraine, unspecified, not intractable, without status migrainosus: Secondary | ICD-10-CM | POA: Diagnosis not present

## 2019-10-31 DIAGNOSIS — Z880 Allergy status to penicillin: Secondary | ICD-10-CM | POA: Diagnosis not present

## 2019-10-31 DIAGNOSIS — Z6841 Body Mass Index (BMI) 40.0 and over, adult: Secondary | ICD-10-CM | POA: Diagnosis not present

## 2019-10-31 DIAGNOSIS — Z9851 Tubal ligation status: Secondary | ICD-10-CM | POA: Diagnosis not present

## 2019-10-31 DIAGNOSIS — Z833 Family history of diabetes mellitus: Secondary | ICD-10-CM | POA: Diagnosis not present

## 2019-10-31 DIAGNOSIS — K469 Unspecified abdominal hernia without obstruction or gangrene: Secondary | ICD-10-CM | POA: Diagnosis not present

## 2019-10-31 DIAGNOSIS — Z8249 Family history of ischemic heart disease and other diseases of the circulatory system: Secondary | ICD-10-CM | POA: Diagnosis not present

## 2019-10-31 LAB — CBC
HCT: 37.9 % (ref 36.0–46.0)
Hemoglobin: 11.9 g/dL — ABNORMAL LOW (ref 12.0–15.0)
MCH: 27.9 pg (ref 26.0–34.0)
MCHC: 31.4 g/dL (ref 30.0–36.0)
MCV: 89 fL (ref 80.0–100.0)
Platelets: 302 10*3/uL (ref 150–400)
RBC: 4.26 MIL/uL (ref 3.87–5.11)
RDW: 13.9 % (ref 11.5–15.5)
WBC: 14.1 10*3/uL — ABNORMAL HIGH (ref 4.0–10.5)
nRBC: 0 % (ref 0.0–0.2)

## 2019-10-31 LAB — SURGICAL PATHOLOGY

## 2019-10-31 MED ORDER — KETOROLAC TROMETHAMINE 30 MG/ML IJ SOLN
INTRAMUSCULAR | Status: AC
  Filled 2019-10-31: qty 1

## 2019-10-31 MED ORDER — OXYCODONE-ACETAMINOPHEN 5-325 MG PO TABS: 1.0000 | ORAL_TABLET | ORAL | 0 refills | Status: DC | PRN

## 2019-10-31 NOTE — Discharge Instructions (Signed)
Laparoscopically Assisted Vaginal Hysterectomy, Care After This sheet gives you information about how to care for yourself after your procedure. Your health care provider may also give you more specific instructions. If you have problems or questions, contact your health care provider. What can I expect after the procedure? After the procedure, it is common to have:  Soreness and numbness in your incision areas.  Abdominal pain. You will be given pain medicine to control it.  Vaginal bleeding and discharge. You will need to use a sanitary napkin after this procedure.  Sore throat from the breathing tube that was inserted during surgery. Follow these instructions at home: Medicines  Take over-the-counter and prescription medicines only as told by your health care provider.  Do not take aspirin or ibuprofen. These medicines can cause bleeding.  Do not drive or use heavy machinery while taking prescription pain medicine.  Do not drive for 24 hours if you were given a medicine to help you relax (sedative) during the procedure. Incision care   Follow instructions from your health care provider about how to take care of your incisions. Make sure you: ? Wash your hands with soap and water before you change your bandage (dressing). If soap and water are not available, use hand sanitizer. ? Change your dressing as told by your health care provider. ? Leave stitches (sutures), skin glue, or adhesive strips in place. These skin closures may need to stay in place for 2 weeks or longer. If adhesive strip edges start to loosen and curl up, you may trim the loose edges. Do not remove adhesive strips completely unless your health care provider tells you to do that.  Check your incision area every day for signs of infection. Check for: ? Redness, swelling, or pain. ? Fluid or blood. ? Warmth. ? Pus or a bad smell. Activity  Get regular exercise as told by your health care provider. You may be  told to take short walks every day and go farther each time.  Return to your normal activities as told by your health care provider. Ask your health care provider what activities are safe for you.  Do not douche, use tampons, or have sexual intercourse for at least 6 weeks, or until your health care provider gives you permission.  Do not lift anything that is heavier than 10 lb (4.5 kg), or the limit that your health care provider tells you, until he or she says that it is safe. General instructions  Do not take baths, swim, or use a hot tub until your health care provider approves. Take showers instead of baths.  Do not drive for 24 hours if you received a sedative.  Do not drive or operate heavy machinery while taking prescription pain medicine.  To prevent or treat constipation while you are taking prescription pain medicine, your health care provider may recommend that you: ? Drink enough fluid to keep your urine clear or pale yellow. ? Take over-the-counter or prescription medicines. ? Eat foods that are high in fiber, such as fresh fruits and vegetables, whole grains, and beans. ? Limit foods that are high in fat and processed sugars, such as fried and sweet foods.  Keep all follow-up visits as told by your health care provider. This is important. Contact a health care provider if:  You have signs of infection, such as: ? Redness, swelling, or pain around your incision sites. ? Fluid or blood coming from an incision. ? An incision that feels warm to the   touch. ? Pus or a bad smell coming from an incision.  Your incision breaks open.  Your pain medicine is not helping.  You feel dizzy or light-headed.  You have pain or bleeding when you urinate.  You have persistent nausea and vomiting.  You have blood, pus, or a bad-smelling discharge from your vagina. Get help right away if:  You have a fever.  You have severe abdominal pain.  You have chest pain.  You have  shortness of breath.  You faint.  You have pain, swelling, or redness in your leg.  You have heavy bleeding from your vagina. Summary  After the procedure, it is common to have abdominal pain and vaginal bleeding.  You should not drive or lift heavy objects until your health care provider says that it is safe.  Contact your health care provider if you have any symptoms of infection, excessive vaginal bleeding, nausea, vomiting, or shortness of breath. This information is not intended to replace advice given to you by your health care provider. Make sure you discuss any questions you have with your health care provider. Document Revised: 05/18/2017 Document Reviewed: 08/01/2016 Elsevier Patient Education  2020 Elsevier Inc.  

## 2019-12-01 ENCOUNTER — Other Ambulatory Visit: Payer: Self-pay

## 2019-12-02 ENCOUNTER — Encounter: Payer: Self-pay | Admitting: Adult Health

## 2019-12-02 ENCOUNTER — Ambulatory Visit (INDEPENDENT_AMBULATORY_CARE_PROVIDER_SITE_OTHER): Payer: BC Managed Care – PPO | Admitting: Adult Health

## 2019-12-02 VITALS — BP 112/82 | Temp 98.2°F | Wt 288.0 lb

## 2019-12-02 DIAGNOSIS — G43109 Migraine with aura, not intractable, without status migrainosus: Secondary | ICD-10-CM | POA: Diagnosis not present

## 2019-12-02 NOTE — Progress Notes (Signed)
Subjective:    Patient ID: Lori [name], female    DOB: [birth year], 29 y.o.   MRN: [id number]  HPI 29 year old female who  has a past medical history of Abnormal uterine bleeding, GERD (gastroesophageal reflux disease), Iron (Fe) deficiency anemia, Migraine, Migraine with aura, Obesity, and PONV (postoperative nausea and vomiting).  She presents to the office today for follow up after starting Elavil 25 mg for migraine prophylactic. She reports that the medication works and she has had far less migraines but she does wake up feeling tired and this lasts for a few hours in the morning.    Review of Systems See HPI   Past Medical History:  Diagnosis Date   Abnormal uterine bleeding    GERD (gastroesophageal reflux disease)    Iron (Fe) deficiency anemia    Migraine    Migraine with aura    Obesity    PONV (postoperative nausea and vomiting)     Social History   Socioeconomic History   Marital status: Single    Spouse name: Not on file   Number of children: Not on file   Years of education: Not on file   Highest education level: Not on file  Occupational History   Not on file  Tobacco Use   Smoking status: Never Smoker   Smokeless tobacco: Never Used  Vaping Use   Vaping Use: Never used  Substance and Sexual Activity   Alcohol use: Not Currently    Comment: social   Drug use: No   Sexual activity: Yes    Partners: Male    Birth control/protection: Surgical  Other Topics Concern   Not on file  Social History Narrative   She is a Psychologist, counselling    Not married   Two children ( daughter 52 , son 3)       Social Determinants of Radio broadcast assistant Strain:    Difficulty of Paying Living Expenses:   Food Insecurity:    Worried About Charity fundraiser in the Last Year:    Arboriculturist in the Last Year:   Transportation Needs:    Film/video editor (Medical):    Lack of Transportation (Non-Medical):   Physical  Activity:    Days of Exercise per Week:    Minutes of Exercise per Session:   Stress:    Feeling of Stress :   Social Connections:    Frequency of Communication with Friends and Family:    Frequency of Social Gatherings with Friends and Family:    Attends Religious Services:    Active Member of Clubs or Organizations:    Attends Music therapist:    Marital Status:   Intimate Partner Violence:    Fear of Current or Ex-Partner:    Emotionally Abused:    Physically Abused:    Sexually Abused:     Past Surgical History:  Procedure Laterality Date   DILATION AND CURETTAGE, DIAGNOSTIC / THERAPEUTIC  2015   ROBOTIC ASSISTED LAPAROSCOPIC HYSTERECTOMY AND SALPINGECTOMY Bilateral 10/30/2019   Procedure: XI ROBOTIC ASSISTED LAPAROSCOPIC HYSTERECTOMY AND SALPINGECTOMY;  Surgeon: Brien Few, MD;  Location: [place];  Service: Gynecology;  Laterality: Bilateral;  Tracie RNFA confirmed 10/13/19 CS   TUBAL LIGATION  2014   WISDOM TOOTH EXTRACTION      Family History  Problem Relation Age of Onset   Diabetes Mother    Heart disease Mother    Diabetes Father  Kidney disease Father    Asthma Sister    Seizures Sister    Asthma Daughter    Von Willebrand disease Son    Asthma Sister    Diabetes Maternal Grandmother    Heart disease Maternal Grandmother    Diabetes Maternal Grandfather    Diabetes Paternal Grandmother    Lupus Paternal Grandmother    Diabetes Paternal Grandfather     Allergies  Allergen Reactions   Benylin Adult Formula [Dextromethorphan] Shortness Of Breath   Amoxicillin Hives, Itching and Swelling   Benadryl [Diphenhydramine Hcl] Hives   Cefdinir Hives   Codeine Itching   Tdap [Tetanus-Diphth-Acell Pertussis]    Tramadol Itching    Current Outpatient Medications on File Prior to Visit  Medication Sig Dispense Refill   amitriptyline (ELAVIL) 25 MG tablet TAKE 1 TABLET BY MOUTH  EVERYDAY AT BEDTIME (Patient taking differently: Take 25 mg by mouth at bedtime. ) 90 tablet 1   No current facility-administered medications on file prior to visit.    BP 112/82    Temp 98.2 F (36.8 C)    Wt 288 lb (130.6 kg)    BMI 49.44 kg/m       Objective:   Physical Exam Vitals and nursing note reviewed.  Constitutional:      Appearance: Normal appearance. She is obese.  Eyes:     Extraocular Movements: Extraocular movements intact.  Cardiovascular:     Rate and Rhythm: Normal rate and regular rhythm.     Pulses: Normal pulses.     Heart sounds: Normal heart sounds.  Pulmonary:     Effort: Pulmonary effort is normal.     Breath sounds: Normal breath sounds.  Musculoskeletal:        General: Normal range of motion.  Skin:    General: Skin is warm and dry.  Neurological:     General: No focal deficit present.     Mental Status: She is alert and oriented to person, place, and time.  Psychiatric:        Mood and Affect: Mood normal.        Behavior: Behavior normal.        Thought Content: Thought content normal.        Judgment: Judgment normal.        Assessment & Plan:  1. Migraine with aura and without status migrainosus, not intractable - Will have her try cutting the medication in half. She will send me a mychart message if she continues to have fatigue in the morning.    Shirline Frees, NP

## 2020-03-09 DIAGNOSIS — Z113 Encounter for screening for infections with a predominantly sexual mode of transmission: Secondary | ICD-10-CM | POA: Diagnosis not present

## 2020-03-09 DIAGNOSIS — N390 Urinary tract infection, site not specified: Secondary | ICD-10-CM | POA: Diagnosis not present

## 2021-07-28 ENCOUNTER — Encounter: Payer: Self-pay | Admitting: Adult Health

## 2021-07-28 ENCOUNTER — Ambulatory Visit (INDEPENDENT_AMBULATORY_CARE_PROVIDER_SITE_OTHER): Payer: BC Managed Care – PPO | Admitting: Adult Health

## 2021-07-28 VITALS — BP 110/80 | HR 95 | Temp 98.5°F | Ht 65.0 in | Wt 297.0 lb

## 2021-07-28 DIAGNOSIS — Z23 Encounter for immunization: Secondary | ICD-10-CM | POA: Diagnosis not present

## 2021-07-28 DIAGNOSIS — Z0000 Encounter for general adult medical examination without abnormal findings: Secondary | ICD-10-CM | POA: Diagnosis not present

## 2021-07-28 DIAGNOSIS — E668 Other obesity: Secondary | ICD-10-CM

## 2021-07-28 DIAGNOSIS — G4730 Sleep apnea, unspecified: Secondary | ICD-10-CM

## 2021-07-28 DIAGNOSIS — G43109 Migraine with aura, not intractable, without status migrainosus: Secondary | ICD-10-CM

## 2021-07-28 DIAGNOSIS — E6689 Other obesity not elsewhere classified: Secondary | ICD-10-CM

## 2021-07-28 LAB — COMPREHENSIVE METABOLIC PANEL
ALT: 16 U/L (ref 0–35)
AST: 17 U/L (ref 0–37)
Albumin: 4 g/dL (ref 3.5–5.2)
Alkaline Phosphatase: 90 U/L (ref 39–117)
BUN: 9 mg/dL (ref 6–23)
CO2: 31 mEq/L (ref 19–32)
Calcium: 8.9 mg/dL (ref 8.4–10.5)
Chloride: 105 mEq/L (ref 96–112)
Creatinine, Ser: 0.71 mg/dL (ref 0.40–1.20)
GFR: 114.39 mL/min (ref >60.00)
Glucose, Bld: 92 mg/dL (ref 70–99)
Potassium: 3.7 mEq/L (ref 3.5–5.1)
Sodium: 141 mEq/L (ref 135–145)
Total Bilirubin: 0.4 mg/dL (ref 0.2–1.2)
Total Protein: 7.8 g/dL (ref 6.0–8.3)

## 2021-07-28 LAB — CBC WITH DIFFERENTIAL/PLATELET
Basophils Absolute: 0.1 10*3/uL (ref 0.0–0.1)
Basophils Relative: 0.8 % (ref 0.0–3.0)
Eosinophils Absolute: 0.1 10*3/uL (ref 0.0–0.7)
Eosinophils Relative: 2.1 % (ref 0.0–5.0)
HCT: 40.6 % (ref 36.0–46.0)
Hemoglobin: 13.1 g/dL (ref 12.0–15.0)
Lymphocytes Relative: 25.5 % (ref 12.0–46.0)
Lymphs Abs: 1.7 10*3/uL (ref 0.7–4.0)
MCHC: 32.3 g/dL (ref 30.0–36.0)
MCV: 85.3 fl (ref 78.0–100.0)
Monocytes Absolute: 0.4 10*3/uL (ref 0.1–1.0)
Monocytes Relative: 6.3 % (ref 3.0–12.0)
Neutro Abs: 4.4 10*3/uL (ref 1.4–7.7)
Neutrophils Relative %: 65.3 % (ref 43.0–77.0)
Platelets: 333 10*3/uL (ref 150.0–400.0)
RBC: 4.76 Mil/uL (ref 3.87–5.11)
RDW: 13.9 % (ref 11.5–15.5)
WBC: 6.7 10*3/uL (ref 4.0–10.5)

## 2021-07-28 LAB — LIPID PANEL
Cholesterol: 180 mg/dL (ref 0–200)
HDL: 46.9 mg/dL (ref >39.00)
LDL Cholesterol: 119 mg/dL — ABNORMAL HIGH (ref 0–99)
NonHDL: 133.18
Total CHOL/HDL Ratio: 4
Triglycerides: 71 mg/dL (ref 0.0–149.0)
VLDL: 14.2 mg/dL (ref 0.0–40.0)

## 2021-07-28 LAB — HEMOGLOBIN A1C: Hgb A1c MFr Bld: 5.6 % (ref 4.6–6.5)

## 2021-07-28 LAB — TSH: TSH: 1.2 u[IU]/mL (ref 0.35–5.50)

## 2021-07-28 MED ORDER — SEMAGLUTIDE-WEIGHT MANAGEMENT 0.25 MG/0.5ML ~~LOC~~ SOAJ: 0.2500 mg | SUBCUTANEOUS | 0 refills | Status: DC

## 2021-07-28 MED ORDER — AIMOVIG 70 MG/ML ~~LOC~~ SOAJ: 70.0000 mg | SUBCUTANEOUS | 3 refills | Status: DC

## 2021-07-28 NOTE — Progress Notes (Signed)
Subjective:    Patient ID: Lori [name], female    DOB: 06-Jan-1991, 31 y.o.   MRN: [id number]  HPI Patient presents for yearly preventative medicine examination. She is a pleasant 31 year old female who  has a past medical history of Abnormal uterine bleeding, GERD (gastroesophageal reflux disease), Iron (Fe) deficiency anemia, Migraine, Migraine with aura, Obesity, and PONV (postoperative nausea and vomiting).  She was last seen in June 2021  Migraine Headaches -she has been seen by headache specialist in the past also tried on multiple medications including Imitrex, Maxalt, Topamax, propanolol.  Most recently in June 2021 she was started on Elavil 25mg  , this worked to some degree but made her feel very groggy in the morning despite cutting in 2 to 12.5 mg dose.  She has not taken it in quite some time.  Continues to have multiple migraines a week and is taking Motrin with minimal improvement.  She does report that she wakes up with a headache will have a headache throughout the entire day into the evening.  She does snore and has apneic episodes.  Obesity -she has been walking about 2 to 3 miles a day, has improved her diet despite this she has not been able to lose weight.  She has a strong family history of obesity.  Has tried multiple diets and therapy in the past without any significant weight loss.  In the past she has thought about weight loss surgery but does not want to go through with this and that she absolutely has to.  Wt Readings from Last 3 Encounters:  07/28/21 297 lb (134.7 kg)  12/02/19 288 lb (130.6 kg)  10/30/19 287 lb 9.6 oz (130.5 kg)    All immunizations and health maintenance protocols were reviewed with the patient and needed orders were placed.  Appropriate screening laboratory values were ordered for the patient including screening of hyperlipidemia, renal function and hepatic function.  Medication reconciliation,  past medical history, social history,  problem list and allergies were reviewed in detail with the patient  Goals were established with regard to weight loss, exercise, and  diet in compliance with medications.   Review of Systems  Constitutional: Negative.   HENT: Negative.    Eyes: Negative.   Respiratory: Negative.    Cardiovascular: Negative.   Gastrointestinal: Negative.   Endocrine: Negative.   Genitourinary: Negative.   Musculoskeletal: Negative.   Skin: Negative.   Allergic/Immunologic: Negative.   Neurological:  Positive for headaches.  Hematological: Negative.   Psychiatric/Behavioral: Negative.     Past Medical History:  Diagnosis Date   Abnormal uterine bleeding    GERD (gastroesophageal reflux disease)    Iron (Fe) deficiency anemia    Migraine    Migraine with aura    Obesity    PONV (postoperative nausea and vomiting)     Social History   Socioeconomic History   Marital status: Single    Spouse name: Not on file   Number of children: Not on file   Years of education: Not on file   Highest education level: Not on file  Occupational History   Not on file  Tobacco Use   Smoking status: Never   Smokeless tobacco: Never  Vaping Use   Vaping Use: Never used  Substance and Sexual Activity   Alcohol use: Not Currently    Comment: social   Drug use: No   Sexual activity: Yes    Partners: Male    Birth control/protection: Surgical  Other Topics Concern   Not on file  Social History Narrative   She is a Psychologist, counselling    Not married   Two children ( daughter 66 , son 3)       Social Determinants of Radio broadcast assistant Strain: Not on file  Food Insecurity: Not on file  Transportation Needs: Not on file  Physical Activity: Not on file  Stress: Not on file  Social Connections: Not on file  Intimate Partner Violence: Not on file    Past Surgical History:  Procedure Laterality Date   [place], DIAGNOSTIC / THERAPEUTIC  2015   ROBOTIC ASSISTED LAPAROSCOPIC  HYSTERECTOMY AND SALPINGECTOMY Bilateral 10/30/2019   Procedure: XI ROBOTIC [place] SALPINGECTOMY;  Surgeon: Brien Few, MD;  Location: [place];  Service: Gynecology;  Laterality: Bilateral;  Tracie RNFA confirmed 10/13/19 CS   TUBAL LIGATION  2014   WISDOM TOOTH EXTRACTION      Family History  Problem Relation Age of Onset   Diabetes Mother    Heart disease Mother    Diabetes Father    Kidney disease Father    Asthma Sister    Seizures Sister    Asthma Daughter    Von Willebrand disease Son    Asthma Sister    Diabetes Maternal Grandmother    Heart disease Maternal Grandmother    Diabetes Maternal Grandfather    Diabetes Paternal Grandmother    Lupus Paternal Grandmother    Diabetes Paternal Grandfather     Allergies  Allergen Reactions   Benylin Adult Formula [Dextromethorphan] Shortness Of Breath   Amoxicillin Hives, Itching and Swelling   Benadryl [Diphenhydramine Hcl] Hives   Cefdinir Hives   Codeine Itching   Tdap [Tetanus-Diphth-Acell Pertussis]    Tramadol Itching    Current Outpatient Medications on File Prior to Visit  Medication Sig Dispense Refill   amitriptyline (ELAVIL) 25 MG tablet TAKE 1 TABLET BY MOUTH EVERYDAY AT BEDTIME (Patient taking differently: Take 25 mg by mouth at bedtime. ) 90 tablet 1   No current facility-administered medications on file prior to visit.    BP 110/80    Pulse 95    Temp 98.5 F (36.9 C) (Oral)    Ht 5\' 5"  (1.651 m)    Wt 297 lb (134.7 kg)    LMP 10/18/2019 Comment: HYSTERECTOMY 10/18/2019   SpO2 100%    BMI 49.42 kg/m       Objective:   Physical Exam Vitals and nursing note reviewed.  Constitutional:      General: She is not in acute distress.    Appearance: Normal appearance. She is well-developed. She is not ill-appearing.  HENT:     Head: Normocephalic and atraumatic.     Right Ear: Tympanic membrane, ear canal and external ear normal. There is no impacted  cerumen.     Left Ear: Tympanic membrane, ear canal and external ear normal. There is no impacted cerumen.     Nose: Nose normal. No congestion or rhinorrhea.     Mouth/Throat:     Mouth: Mucous membranes are moist.     Pharynx: Oropharynx is clear. No oropharyngeal exudate or posterior oropharyngeal erythema.  Eyes:     General:        Right eye: No discharge.        Left eye: No discharge.     Extraocular Movements: Extraocular movements intact.     Conjunctiva/sclera: Conjunctivae normal.     Pupils:  Pupils are equal, round, and reactive to light.  Neck:     Thyroid: No thyromegaly.     Vascular: No carotid bruit.     Trachea: No tracheal deviation.  Cardiovascular:     Rate and Rhythm: Normal rate and regular rhythm.     Pulses: Normal pulses.     Heart sounds: Normal heart sounds. No murmur heard.   No friction rub. No gallop.  Pulmonary:     Effort: Pulmonary effort is normal. No respiratory distress.     Breath sounds: Normal breath sounds. No stridor. No wheezing, rhonchi or rales.  Chest:     Chest wall: No tenderness.  Abdominal:     General: Abdomen is flat. Bowel sounds are normal. There is no distension.     Palpations: Abdomen is soft. There is no mass.     Tenderness: There is no abdominal tenderness. There is no right CVA tenderness, left CVA tenderness, guarding or rebound.     Hernia: No hernia is present.  Musculoskeletal:        General: No swelling, tenderness, deformity or signs of injury. Normal range of motion.     Cervical back: Normal range of motion and neck supple.     Right lower leg: No edema.     Left lower leg: No edema.  Lymphadenopathy:     Cervical: No cervical adenopathy.  Skin:    General: Skin is warm and dry.     Coloration: Skin is not jaundiced or pale.     Findings: No bruising, erythema, lesion or rash.  Neurological:     General: No focal deficit present.     Mental Status: She is alert and oriented to person, place, and time.      Cranial Nerves: No cranial nerve deficit.     Sensory: No sensory deficit.     Motor: No weakness.     Coordination: Coordination normal.     Gait: Gait normal.     Deep Tendon Reflexes: Reflexes normal.  Psychiatric:        Mood and Affect: Mood normal.        Behavior: Behavior normal.        Thought Content: Thought content normal.        Judgment: Judgment normal.       Assessment & Plan:  1. Routine general medical examination at a health care facility -Continue with heart healthy diet and exercise. -Follow-up in 1 year for next CPE - CBC with Differential/Platelet; Future - Comprehensive metabolic panel; Future - Hemoglobin A1c; Future - Lipid panel; Future - TSH; Future - TSH - Lipid panel - Hemoglobin A1c - Comprehensive metabolic panel - CBC with Differential/Platelet  2. Migraine with aura and without status migrainosus, not intractable -We will trial her on Aimovig 70 mg monthly.  Can increase to 140 mg monthly if needed. - CBC with Differential/Platelet; Future - Comprehensive metabolic panel; Future - Hemoglobin A1c; Future - Lipid panel; Future - TSH; Future - Erenumab-aooe (AIMOVIG) 70 MG/ML SOAJ; Inject 70 mg into the skin every 30 (thirty) days.  Dispense: 1.12 mL; Refill: 3 - TSH - Lipid panel - Hemoglobin A1c - Comprehensive metabolic panel - CBC with Differential/Platelet  3. Other obesity -We will start on Wegovy 0.25 mg.  Follow-up in 1 month or sooner if needed.  We did review side effects of this medication - CBC with Differential/Platelet; Future - Comprehensive metabolic panel; Future - Hemoglobin A1c; Future - Lipid panel; Future - TSH;  Future - Semaglutide-Weight Management 0.25 MG/0.5ML SOAJ; Inject 0.25 mg into the skin once a week.  Dispense: 2 mL; Refill: 0 - TSH - Lipid panel - Hemoglobin A1c - Comprehensive metabolic panel - CBC with Differential/Platelet  4. Sleep apnea, unspecified type  - Ambulatory referral to  Pulmonology  5. Need for influenza vaccination  - Flu Vaccine MDCK QUAD PF  Dorothyann Peng, NP

## 2021-07-28 NOTE — Patient Instructions (Addendum)
I am going to start you on Wegovy for weight loss and Aimovig for migraine headaches   There are savings cards online or both of these medications   Someone from pulmonary will call you to schedule your appointment for a sleep study

## 2021-07-29 NOTE — Telephone Encounter (Signed)
Received Pas for both Rx. Pt notified that a PA will be started and verbalized understanding.

## 2021-07-29 NOTE — Telephone Encounter (Signed)
Spoke to pt and she stated that Aimovig is waiting on approval and Reginal Lutes was not showing up as sent to pharmacy. I advised pt that both were sent in but I will call the pharmacy for clarification.

## 2021-08-02 ENCOUNTER — Telehealth: Payer: Self-pay | Admitting: Adult Health

## 2021-08-02 NOTE — Telephone Encounter (Signed)
Spoke to pt and advised that both medications were approved. Pt will contact her pharmacy to follow. Advised to return call if any concerns.

## 2021-08-02 NOTE — Telephone Encounter (Signed)
Patient called in to request a phone call from [facility]. Patient stated that her claim was denied.  Patient could be contacted at [phone].  Please advise.

## 2021-08-18 ENCOUNTER — Encounter: Payer: Self-pay | Admitting: Primary Care

## 2021-08-18 ENCOUNTER — Ambulatory Visit (INDEPENDENT_AMBULATORY_CARE_PROVIDER_SITE_OTHER): Payer: BC Managed Care – PPO | Admitting: Primary Care

## 2021-08-18 ENCOUNTER — Other Ambulatory Visit: Payer: Self-pay

## 2021-08-18 VITALS — BP 130/68 | HR 98 | Temp 98.2°F | Ht 64.0 in | Wt 293.4 lb

## 2021-08-18 DIAGNOSIS — R0683 Snoring: Secondary | ICD-10-CM | POA: Insufficient documentation

## 2021-08-18 NOTE — Patient Instructions (Addendum)
Sleep apnea is defined as period of 10 seconds or longer when you stop breathing at night. This can happen multiple times a night. Dx sleep apnea is when this occurs more than 5 times an hour.  ?  ?Mild OSA 5-15 apneic events an hour ?Moderate OSA 15-30 apneic events an hour ?Severe OSA > 30 apneic events an hour ?  ?Untreated sleep apnea puts you at higher risk for cardiac arrhythmias, pulmonary HTN, stroke and diabetes ?  ?Treatment options include weight loss, side sleeping position, oral appliance, CPAP therapy or referral to ENT for possible surgical options  ?  ?Recommendations: ?Focus on side sleeping position or elevate head of bed  ?Continue to work on weight loss efforts  ?Do not drive if experiencing excessive daytime sleepiness of fatigue  ?  ?Orders: ?Home sleep study re: snoring  ?  ?Follow-up: ?4-6 week visit to review sleep study results and discuss treatment options further ? ?

## 2021-08-18 NOTE — Progress Notes (Signed)
Reviewed and agree with assessment/plan. ? ? ?Damontre Millea, MD ?[facility] ?08/18/2021, 11:59 AM ?Pager:  [phone] ? ?

## 2021-08-18 NOTE — Assessment & Plan Note (Signed)
-  Patient has symptoms of loud snoring and gasping for breath while sleeping.  Epworth 14.  Concern patient could have obstructive sleep apnea, needs home sleep study to evaluate. Discussed risk of untreated sleep apnea including cardiac arrhythmias, pulm HTN, stroke, DM. We briefly reviewed treatment options. Encouraged patient to work on weight loss efforts and focus on side sleeping position/elevate head of bed. Advised against driving if experiencing excessive daytime sleepiness. Follow-up in 4-6 weeks to review sleep study results and discuss treatment options further.  ?

## 2021-08-18 NOTE — Progress Notes (Signed)
? ?@Patient  ID: , female    DOB: 09-Oct-1990, 31 y.o.   MRN: [id number] ? ?Chief Complaint  ?Patient presents with  ? Consult  ? ? ?Referring provider: ?[id number], NP ? ?HPI: ?31 year old female, never smoked.  Past medical history significant for abnormal uterine bleeding, hysterectomy, migraine, obesity. ? ?08/18/2021 ?Patient presents today for sleep consult. He has symptoms of loud snoring. This has been going on for several years. Her 48 year old daughter also snores and will be having a sleep study tomorrow. She has never had a sleep study. Typical bedtime is between 9:30-10:30pm. It does not take her long to fall asleep. She wakes up on average twice a night. She starts her day at 6:30am. Her weight is up 20 lbs, she was recently started on Wegovy to help with weight loss. She has some mild nausea symptoms but overall is tolerating medication. Denies cataplexy, narcolepsy or sleep walking.  ? ?Sleep questionnaire ?Symptoms-  Snoring, gasping for breath while sleeping  ?Prior sleep study- None ?Bedtime- 9:30-10:30pm ?Time to fall asleep- not long  ?Nocturnal awakenings- 2 times  ?Out of bed/start of day- 6:30am ?Weight changes- up 20 pounds  ?Do you operate heavy machinery- No ?Do you currently wear CPAP- No ?Do you current wear oxygen- No ?Epworth- 14 ? ?Allergies  ?Allergen Reactions  ? Benylin Adult Formula [Dextromethorphan] Shortness Of Breath  ? Amoxicillin Hives, Itching and Swelling  ? Benadryl [Diphenhydramine Hcl] Hives  ? Cefdinir Hives  ? Codeine Itching  ? Tdap [Tetanus-Diphth-Acell Pertussis]   ? Tramadol Itching  ? ? ?Immunization History  ?Administered Date(s) Administered  ? Influenza Inj Mdck Quad Pf 07/28/2021  ? Influenza,inj,Quad PF,6+ Mos 04/03/2017, 04/05/2018  ? Moderna Sars-Covid-2 Vaccination 12/04/2019, 01/02/2020  ? Tdap 04/03/2017  ? ? ?Past Medical History:  ?Diagnosis Date  ? Abnormal uterine bleeding   ? GERD (gastroesophageal reflux disease)   ? Iron (Fe)  deficiency anemia   ? Migraine   ? Migraine with aura   ? Obesity   ? PONV (postoperative nausea and vomiting)   ? ? ?Tobacco History: ?Social History  ? ?Tobacco Use  ?Smoking Status Never  ?Smokeless Tobacco Never  ? ?Counseling given: Not Answered ? ? ?Outpatient Medications Prior to Visit  ?Medication Sig Dispense Refill  ? Erenumab-aooe (AIMOVIG) 70 MG/ML SOAJ Inject 70 mg into the skin every 30 (thirty) days. 1.12 mL 3  ? Semaglutide-Weight Management 0.25 MG/0.5ML SOAJ Inject 0.25 mg into the skin once a week. 2 mL 0  ? amitriptyline (ELAVIL) 25 MG tablet TAKE 1 TABLET BY MOUTH EVERYDAY AT BEDTIME (Patient not taking: Reported on 08/18/2021) 90 tablet 1  ? ?No facility-administered medications prior to visit.  ? ?Review of Systems ? ?Review of Systems  ?Constitutional:  Positive for fatigue.  ?HENT: Negative.    ?Respiratory: Negative.    ?Psychiatric/Behavioral:  Positive for sleep disturbance.   ? ? ?Physical Exam ? ?BP 130/68 (BP Location: Left Arm, Cuff Size: Large)   Pulse 98   Temp 98.2 ?F (36.8 ?C) (Temporal)   Ht 5\' 4"  (1.626 m)   Wt 293 lb 6.4 oz (133.1 kg)   LMP 10/18/2019 Comment: HYSTERECTOMY 10/18/2019  SpO2 99%   BMI 50.36 kg/m?  ?Physical Exam ?Constitutional:   ?   General: She is not in acute distress. ?   Appearance: Normal appearance. She is not ill-appearing.  ?HENT:  ?   Head: Normocephalic and atraumatic.  ?   Right Ear: Tympanic membrane normal.  ?  Left Ear: Tympanic membrane normal.  ?   Mouth/Throat:  ?   Mouth: Mucous membranes are moist.  ?   Pharynx: Oropharynx is clear.  ?Cardiovascular:  ?   Rate and Rhythm: Normal rate and regular rhythm.  ?Pulmonary:  ?   Effort: Pulmonary effort is normal.  ?   Breath sounds: Normal breath sounds.  ?Musculoskeletal:     ?   General: Normal range of motion.  ?Skin: ?   General: Skin is warm and dry.  ?Neurological:  ?   General: No focal deficit present.  ?   Mental Status: She is alert and oriented to person, place, and time. Mental  status is at baseline.  ?Psychiatric:     ?   Mood and Affect: Mood normal.     ?   Behavior: Behavior normal.     ?   Thought Content: Thought content normal.     ?   Judgment: Judgment normal.  ?  ? ?Lab Results: ? ?CBC ?   ?Component Value Date/Time  ? WBC 6.7 07/28/2021 1001  ? RBC 4.76 07/28/2021 1001  ? HGB 13.1 07/28/2021 1001  ? HGB 11.9 07/27/2017 1703  ? HCT 40.6 07/28/2021 1001  ? HCT 35.9 07/27/2017 1703  ? PLT 333.0 07/28/2021 1001  ? PLT 385 (H) 07/27/2017 1703  ? MCV 85.3 07/28/2021 1001  ? MCV 84 07/27/2017 1703  ? MCH 27.9 10/31/2019 0523  ? MCHC 32.3 07/28/2021 1001  ? RDW 13.9 07/28/2021 1001  ? RDW 13.6 07/27/2017 1703  ? LYMPHSABS 1.7 07/28/2021 1001  ? MONOABS 0.4 07/28/2021 1001  ? EOSABS 0.1 07/28/2021 1001  ? BASOSABS 0.1 07/28/2021 1001  ? ? ?BMET ?   ?Component Value Date/Time  ? NA 141 07/28/2021 1001  ? K 3.7 07/28/2021 1001  ? CL 105 07/28/2021 1001  ? CO2 31 07/28/2021 1001  ? GLUCOSE 92 07/28/2021 1001  ? BUN 9 07/28/2021 1001  ? CREATININE 0.71 07/28/2021 1001  ? CALCIUM 8.9 07/28/2021 1001  ? ? ?BNP ?No results found for: BNP ? ?ProBNP ?No results found for: PROBNP ? ?Imaging: ?No results found. ? ? ?Assessment & Plan:  ? ?Loud snoring ?-Patient has symptoms of loud snoring and gasping for breath while sleeping.  Epworth 14.  Concern patient could have obstructive sleep apnea, needs home sleep study to evaluate. Discussed risk of untreated sleep apnea including cardiac arrhythmias, pulm HTN, stroke, DM. We briefly reviewed treatment options. Encouraged patient to work on weight loss efforts and focus on side sleeping position/elevate head of bed. Advised against driving if experiencing excessive daytime sleepiness. Follow-up in 4-6 weeks to review sleep study results and discuss treatment options further.  ? ? ?Glenford Bayley, NP ?08/18/2021 ? ?

## 2021-08-24 ENCOUNTER — Encounter: Payer: Self-pay | Admitting: Adult Health

## 2021-08-24 ENCOUNTER — Ambulatory Visit (INDEPENDENT_AMBULATORY_CARE_PROVIDER_SITE_OTHER): Payer: BC Managed Care – PPO | Admitting: Adult Health

## 2021-08-24 ENCOUNTER — Ambulatory Visit: Payer: BC Managed Care – PPO | Admitting: Adult Health

## 2021-08-24 VITALS — BP 120/80 | HR 116 | Temp 99.0°F | Ht 64.0 in | Wt 290.0 lb

## 2021-08-24 DIAGNOSIS — G43109 Migraine with aura, not intractable, without status migrainosus: Secondary | ICD-10-CM | POA: Diagnosis not present

## 2021-08-24 DIAGNOSIS — E668 Other obesity: Secondary | ICD-10-CM

## 2021-08-24 MED ORDER — ONDANSETRON HCL 4 MG PO TABS: 4.0000 mg | ORAL_TABLET | Freq: Three times a day (TID) | ORAL | 2 refills | Status: DC | PRN

## 2021-08-24 MED ORDER — WEGOVY 0.5 MG/0.5ML ~~LOC~~ SOAJ: 0.5000 mg | SUBCUTANEOUS | 2 refills | Status: AC

## 2021-08-24 NOTE — Progress Notes (Signed)
? ?Subjective:  ? ? Patient ID: Lori [name], female    DOB: 30-Jun-1990, 30 y.o.   MRN: [id number] ? ?HPI ? ?31 year old female who  has a past medical history of Abnormal uterine bleeding, GERD (gastroesophageal reflux disease), Iron (Fe) deficiency anemia, Migraine, Migraine with aura, Obesity, and PONV (postoperative nausea and vomiting). ? ?She presents to the office today for 1 month follow-up regarding migraines and obesity/weight loss management. ? ?Migraine  headaches-has been seen by headache specialist in the past and has also trialed many medications including Imitrex, Maxalt, Topamax, propanolol and Elavil.  Elavil seem to work the best to some degree but made her feel very groggy in the morning.  She ultimately stopped taking the medication because of the fatigue aspect.  When she was seen she was endorsing multiple migraines a week and taking Motrin with minimal improvement.  She was waking up with a headache and was having headaches throughout the entire day into the evening.  She did have signs and symptoms of sleep apnea and was referred to pulmonary for sleep study but due to history of migraine headaches she was also started on Aimovig. ? ?She reports today that in the first month of starting Aimovig she has had much less frequent and severe migraine headaches. ? ?Weight loss management.  She has been walking about 2 to 3 miles a day and had improved her diet but was not able to lose weight.  She has a strong family history of obesity.  Had tried multiple diets and therapy in the past without any significant weight loss.  She had thought about weight loss surgery but ultimately decided not to pursue this avenue.  She was started on Wegovy 0.25 mg last month.  ? ?Since starting [facility] she has lost roughly 7 pounds per our scale.  She has noticed that [facility] decreases her appetite.  Side effects that she is experienced have included nausea without vomiting and constipation.  She has also  noticed a mildly elevated heart rate while at rest.  Denies chest pain or palpitations. ? ?Wt Readings from Last 5 Encounters:  ?08/24/21 290 lb (131.5 kg)  ?08/18/21 293 lb 6.4 oz (133.1 kg)  ?07/28/21 297 lb (134.7 kg)  ?12/02/19 288 lb (130.6 kg)  ?10/30/19 287 lb 9.6 oz (130.5 kg)  ? ? ?Review of Systems ?See HPI  ? ?Past Medical History:  ?Diagnosis Date  ? Abnormal uterine bleeding   ? GERD (gastroesophageal reflux disease)   ? Iron (Fe) deficiency anemia   ? Migraine   ? Migraine with aura   ? Obesity   ? PONV (postoperative nausea and vomiting)   ? ? ?Social History  ? ?Socioeconomic History  ? Marital status: Single  ?  Spouse name: Not on file  ? Number of children: Not on file  ? Years of education: Not on file  ? Highest education level: Associate degree: occupational, Scientist, product/process development, or vocational program  ?Occupational History  ? Not on file  ?Tobacco Use  ? Smoking status: Never  ? Smokeless tobacco: Never  ?Vaping Use  ? Vaping Use: Never used  ?Substance and Sexual Activity  ? Alcohol use: Not Currently  ?  Comment: social  ? Drug use: No  ? Sexual activity: Yes  ?  Partners: Male  ?  Birth control/protection: Surgical  ?Other Topics Concern  ? Not on file  ?Social History Narrative  ? She is a Agricultural engineer   ? Not married  ?  Two children ( daughter 868 , son 3)   ?   ? ?Social Determinants of Health  ? ?Financial Resource Strain: Low Risk   ? Difficulty of Paying Living Expenses: Not hard at all  ?Food Insecurity: No Food Insecurity  ? Worried About Programme researcher, broadcasting/film/videounning Out of Food in the Last Year: Never true  ? Ran Out of Food in the Last Year: Never true  ?Transportation Needs: No Transportation Needs  ? Lack of Transportation (Medical): No  ? Lack of Transportation (Non-Medical): No  ?Physical Activity: Insufficiently Active  ? Days of Exercise per Week: 3 days  ? Minutes of Exercise per Session: 30 min  ?Stress: No Stress Concern Present  ? Feeling of Stress : Not at all  ?Social Connections: Moderately  Isolated  ? Frequency of Communication with Friends and Family: More than three times a week  ? Frequency of Social Gatherings with Friends and Family: Once a week  ? Attends Religious Services: More than 4 times per year  ? Active Member of Clubs or Organizations: No  ? Attends BankerClub or Organization Meetings: Not on file  ? Marital Status: Never married  ?Intimate Partner Violence: Not on file  ? ? ?Past Surgical History:  ?Procedure Laterality Date  ? DILATION AND CURETTAGE, DIAGNOSTIC / THERAPEUTIC  2015  ? ROBOTIC ASSISTED LAPAROSCOPIC HYSTERECTOMY AND SALPINGECTOMY Bilateral 10/30/2019  ? Procedure: XI ROBOTIC ASSISTED LAPAROSCOPIC HYSTERECTOMY AND SALPINGECTOMY;  Surgeon: Olivia Mackieaavon, Richard, MD;  [facility];  Service: Gynecology;  Laterality: Bilateral;  Tracie RNFA confirmed 10/13/19 CS  ? TUBAL LIGATION  2014  ? WISDOM TOOTH EXTRACTION    ? ? ?Family History  ?Problem Relation Age of Onset  ? Diabetes Mother   ? Heart disease Mother   ? Diabetes Father   ? Kidney disease Father   ? Asthma Sister   ? Seizures Sister   ? Asthma Daughter   ? Von Willebrand disease Son   ? Asthma Sister   ? Diabetes Maternal Grandmother   ? Heart disease Maternal Grandmother   ? Diabetes Maternal Grandfather   ? Diabetes Paternal Grandmother   ? Lupus Paternal Grandmother   ? Diabetes Paternal Grandfather   ? ? ?Allergies  ?Allergen Reactions  ? Benylin Adult Formula [Dextromethorphan] Shortness Of Breath  ? Amoxicillin Hives, Itching and Swelling  ? Benadryl [Diphenhydramine Hcl] Hives  ? Cefdinir Hives  ? Codeine Itching  ? Tdap [Tetanus-Diphth-Acell Pertussis]   ? Tramadol Itching  ? ? ?Current Outpatient Medications on File Prior to Visit  ?Medication Sig Dispense Refill  ? amitriptyline (ELAVIL) 25 MG tablet TAKE 1 TABLET BY MOUTH EVERYDAY AT BEDTIME 90 tablet 1  ? Erenumab-aooe (AIMOVIG) 70 MG/ML SOAJ Inject 70 mg into the skin every 30 (thirty) days. 1.12 mL 3  ? ?No current facility-administered  medications on file prior to visit.  ? ? ?BP 120/80   Pulse (!) 116   Temp 99 ?F (37.2 ?C) (Oral)   Ht 5\' 4"  (1.626 m)   Wt 290 lb (131.5 kg)   LMP 10/18/2019 Comment: HYSTERECTOMY 10/18/2019  SpO2 97%   BMI 49.78 kg/m?  ? ? ?   ?Objective:  ? Physical Exam ?Vitals and nursing note reviewed.  ?Constitutional:   ?   Appearance: Normal appearance.  ?Cardiovascular:  ?   Rate and Rhythm: Regular rhythm. Tachycardia present.  ?   Pulses: Normal pulses.  ?   Heart sounds: Normal heart sounds.  ?Musculoskeletal:     ?  General: Normal range of motion.  ?Skin: ?   General: Skin is warm and dry.  ?   Capillary Refill: Capillary refill takes less than 2 seconds.  ?Neurological:  ?   General: No focal deficit present.  ?   Mental Status: She is alert and oriented to person, place, and time.  ?Psychiatric:     ?   Mood and Affect: Mood normal.     ?   Behavior: Behavior normal.     ?   Thought Content: Thought content normal.     ?   Judgment: Judgment normal.  ? ? ?   ?Assessment & Plan:  ?1. Other obesity ?-We discussed Benefiber or Metamucil to help with constipation, stay hydrated.  We will send in Zofran 4 mg as needed for nausea.  He was advised to keep the heart rate, hopefully this starts to go down as her body gets adjusted to this medication.We will have her follow-up in 3 months or sooner if needed ?- Semaglutide-Weight Management (WEGOVY) 0.5 MG/0.5ML SOAJ; Inject 0.5 mg into the skin once a week.  Dispense: 2 mL; Refill: 2 ?- ondansetron (ZOFRAN) 4 MG tablet; Take 1 tablet (4 mg total) by mouth every 8 (eight) hours as needed for nausea or vomiting.  Dispense: 20 tablet; Refill: 2 ? ?2. Migraine with aura and without status migrainosus, not intractable ?-Appears to be doing well with Aimovig.  Continue with current medication dose ? ?Shirline Frees, NP ? ? ? ?

## 2021-08-24 NOTE — Patient Instructions (Signed)
Health Maintenance Due  ?Topic Date Due  ? COVID-19 Vaccine (3 - Booster for Moderna series) 02/27/2020  ? PAP SMEAR-Modifier  04/04/2020  ? ? ?No flowsheet data found. ? ?

## 2021-08-25 ENCOUNTER — Ambulatory Visit: Payer: BC Managed Care – PPO | Admitting: Adult Health

## 2021-10-03 ENCOUNTER — Telehealth: Payer: Self-pay | Admitting: Primary Care

## 2021-10-03 DIAGNOSIS — R0683 Snoring: Secondary | ICD-10-CM

## 2021-10-03 NOTE — Telephone Encounter (Signed)
Called and spoke with patient who is calling wanting to see if she can do in lab sleep study. I explained that hst's are 10-12 weeks out, pt states "that's too far away." Please advise  ?

## 2021-10-04 NOTE — Telephone Encounter (Signed)
I called and spoke with the pt and advised ok to change to in lab study per [facility]  ?Order placed  ?Nothing further needed ?

## 2021-10-04 NOTE — Telephone Encounter (Signed)
Yes we can change to in lab sleep study

## 2021-10-12 ENCOUNTER — Telehealth: Payer: BC Managed Care – PPO | Admitting: Physician Assistant

## 2021-10-12 DIAGNOSIS — R3989 Other symptoms and signs involving the genitourinary system: Secondary | ICD-10-CM

## 2021-10-12 DIAGNOSIS — T3695XA Adverse effect of unspecified systemic antibiotic, initial encounter: Secondary | ICD-10-CM

## 2021-10-12 DIAGNOSIS — B379 Candidiasis, unspecified: Secondary | ICD-10-CM

## 2021-10-12 MED ORDER — FLUCONAZOLE 150 MG PO TABS: 150.0000 mg | ORAL_TABLET | ORAL | 0 refills | Status: DC | PRN

## 2021-10-12 MED ORDER — SULFAMETHOXAZOLE-TRIMETHOPRIM 800-160 MG PO TABS: 1.0000 | ORAL_TABLET | Freq: Two times a day (BID) | ORAL | 0 refills | Status: DC

## 2021-10-12 NOTE — Patient Instructions (Signed)
?[name], thank you for joining Margaretann Loveless, PA-C for today's virtual visit.  While this provider is not your primary care provider (PCP), if your PCP is located in our provider database this encounter information will be shared with them immediately following your visit. ? ?Consent: ?(Patient) Lori [name] provided verbal consent for this virtual visit at the beginning of the encounter. ? ?Current Medications: ? ?Current Outpatient Medications:  ?  fluconazole (DIFLUCAN) 150 MG tablet, Take 1 tablet (150 mg total) by mouth every 3 (three) days as needed., Disp: 2 tablet, Rfl: 0 ?  sulfamethoxazole-trimethoprim (BACTRIM DS) 800-160 MG tablet, Take 1 tablet by mouth 2 (two) times daily., Disp: 10 tablet, Rfl: 0 ?  amitriptyline (ELAVIL) 25 MG tablet, TAKE 1 TABLET BY MOUTH EVERYDAY AT BEDTIME, Disp: 90 tablet, Rfl: 1 ?  Erenumab-aooe (AIMOVIG) 70 MG/ML SOAJ, Inject 70 mg into the skin every 30 (thirty) days., Disp: 1.12 mL, Rfl: 3 ?  ondansetron (ZOFRAN) 4 MG tablet, Take 1 tablet (4 mg total) by mouth every 8 (eight) hours as needed for nausea or vomiting., Disp: 20 tablet, Rfl: 2  ? ?Medications ordered in this encounter:  ?Meds ordered this encounter  ?Medications  ? sulfamethoxazole-trimethoprim (BACTRIM DS) 800-160 MG tablet  ?  Sig: Take 1 tablet by mouth 2 (two) times daily.  ?  Dispense:  10 tablet  ?  Refill:  0  ?  Order Specific Question:   Supervising Provider  ?  Answer:   Eber Hong [[id number]]  ? fluconazole (DIFLUCAN) 150 MG tablet  ?  Sig: Take 1 tablet (150 mg total) by mouth every 3 (three) days as needed.  ?  Dispense:  2 tablet  ?  Refill:  0  ?  Order Specific Question:   Supervising Provider  ?  Answer:   Eber Hong [[id number]]  ?  ? ?*If you need refills on other medications prior to your next appointment, please contact your pharmacy* ? ?Follow-Up: ?Call back or seek an in-person evaluation if the symptoms worsen or if the condition fails to improve as  anticipated. ? ?Other Instructions ? ?Urinary Tract Infection, Adult ? ?A urinary tract infection (UTI) is an infection of any part of the urinary tract. The urinary tract includes the kidneys, ureters, bladder, and urethra. These organs make, store, and get rid of urine in the body. ?An upper UTI affects the ureters and kidneys. A lower UTI affects the bladder and urethra. ?What are the causes? ?Most urinary tract infections are caused by bacteria in your genital area around your urethra, where urine leaves your body. These bacteria grow and cause inflammation of your urinary tract. ?What increases the risk? ?You are more likely to develop this condition if: ?You have a urinary catheter that stays in place. ?You are not able to control when you urinate or have a bowel movement (incontinence). ?You are female and you: ?Use a spermicide or diaphragm for birth control. ?Have low estrogen levels. ?Are pregnant. ?You have certain genes that increase your risk. ?You are sexually active. ?You take antibiotic medicines. ?You have a condition that causes your flow of urine to slow down, such as: ?An enlarged prostate, if you are female. ?Blockage in your urethra. ?A kidney stone. ?A nerve condition that affects your bladder control (neurogenic bladder). ?Not getting enough to drink, or not urinating often. ?You have certain medical conditions, such as: ?Diabetes. ?A weak disease-fighting system (immunesystem). ?Sickle cell disease. ?Gout. ?Spinal cord injury. ?What are the  signs or symptoms? ?Symptoms of this condition include: ?Needing to urinate right away (urgency). ?Frequent urination. This may include small amounts of urine each time you urinate. ?Pain or burning with urination. ?Blood in the urine. ?Urine that smells bad or unusual. ?Trouble urinating. ?Cloudy urine. ?Vaginal discharge, if you are female. ?Pain in the abdomen or the lower back. ?You may also have: ?Vomiting or a decreased  appetite. ?Confusion. ?Irritability or tiredness. ?A fever or chills. ?Diarrhea. ?The first symptom in older adults may be confusion. In some cases, they may not have any symptoms until the infection has worsened. ?How is this diagnosed? ?This condition is diagnosed based on your medical history and a physical exam. You may also have other tests, including: ?Urine tests. ?Blood tests. ?Tests for STIs (sexually transmitted infections). ?If you have had more than one UTI, a cystoscopy or imaging studies may be done to determine the cause of the infections. ?How is this treated? ?Treatment for this condition includes: ?Antibiotic medicine. ?Over-the-counter medicines to treat discomfort. ?Drinking enough water to stay hydrated. ?If you have frequent infections or have other conditions such as a kidney stone, you may need to see a health care provider who specializes in the urinary tract (urologist). ?In rare cases, urinary tract infections can cause sepsis. Sepsis is a life-threatening condition that occurs when the body responds to an infection. Sepsis is treated in the hospital with IV antibiotics, fluids, and other medicines. ?Follow these instructions at home: ? ?Medicines ?Take over-the-counter and prescription medicines only as told by your health care provider. ?If you were prescribed an antibiotic medicine, take it as told by your health care provider. Do not stop using the antibiotic even if you start to feel better. ?General instructions ?Make sure you: ?Empty your bladder often and completely. Do not hold urine for long periods of time. ?Empty your bladder after sex. ?Wipe from front to back after urinating or having a bowel movement if you are female. Use each tissue only one time when you wipe. ?Drink enough fluid to keep your urine pale yellow. ?Keep all follow-up visits. This is important. ?Contact a health care provider if: ?Your symptoms do not get better after 1-2 days. ?Your symptoms go away and then  return. ?Get help right away if: ?You have severe pain in your back or your lower abdomen. ?You have a fever or chills. ?You have nausea or vomiting. ?Summary ?A urinary tract infection (UTI) is an infection of any part of the urinary tract, which includes the kidneys, ureters, bladder, and urethra. ?Most urinary tract infections are caused by bacteria in your genital area. ?Treatment for this condition often includes antibiotic medicines. ?If you were prescribed an antibiotic medicine, take it as told by your health care provider. Do not stop using the antibiotic even if you start to feel better. ?Keep all follow-up visits. This is important. ?This information is not intended to replace advice given to you by your health care provider. Make sure you discuss any questions you have with your health care provider. ?Document Revised: 01/16/2020 Document Reviewed: 01/16/2020 ?Elsevier Patient Education ? 2023 Elsevier Inc. ? ? ? ?If you have been instructed to have an in-person evaluation today at a local Urgent Care facility, please use the link below. It will take you to a list of all of our available [facility], including address, phone number and hours of operation. Please do not delay care.  ?[facility] ? ?If you or a family member  do not have a primary care provider, use the link below to schedule a visit and establish care. When you choose a [facility] primary care physician or advanced practice provider, you gain a long-term partner in health. ?Find a Primary Care Provider ? ?Learn more about 's in-office and virtual care options: ?[facility] ?

## 2021-10-12 NOTE — Progress Notes (Signed)
?Virtual Visit Consent  ? ?[name], you are scheduled for a virtual visit with a [facility] provider today.   ?  ?Just as with appointments in the office, your consent must be obtained to participate.  Your consent will be active for this visit and any virtual visit you may have with one of our providers in the next 365 days.   ?  ?If you have a MyChart account, a copy of this consent can be sent to you electronically.  All virtual visits are billed to your insurance company just like a traditional visit in the office.   ? ?As this is a virtual visit, video technology does not allow for your provider to perform a traditional examination.  This may limit your provider's ability to fully assess your condition.  If your provider identifies any concerns that need to be evaluated in person or the need to arrange testing (such as labs, EKG, etc.), we will make arrangements to do so.   ?  ?Although advances in technology are sophisticated, we cannot ensure that it will always work on either your end or our end.  If the connection with a video visit is poor, the visit may have to be switched to a telephone visit.  With either a video or telephone visit, we are not always able to ensure that we have a secure connection.    ? ?Also, by engaging in this virtual visit, you consent to the provision of healthcare. Additionally, you authorize for your insurance to be billed (if applicable) for the services provided during this visit.  ? ?I need to obtain your verbal consent now.   Are you willing to proceed with your visit today?  ?  ?Arionna Hoggard has provided verbal consent on 10/12/2021 for a virtual visit (video or telephone). ?  ?Margaretann Loveless, PA-C  ? ?Date: 10/12/2021 7:35 PM ? ? ?Virtual Visit via Video Note  ? ?Lori [name], connected with  Lori [name]  ([id number], [birth year]) on 10/12/21 at  7:30 PM EDT by a video-enabled telemedicine application and verified that I am speaking  with the correct person using two identifiers. ? ?Location: ?Patient: Virtual Visit Location Patient: Home ?Provider: Virtual Visit Location Provider: Home Office ?  ?I discussed the limitations of evaluation and management by telemedicine and the availability of in person appointments. The patient expressed understanding and agreed to proceed.   ? ?History of Present Illness: ?Lori [name] is a 31 y.o. who identifies as a female who was assigned female at birth, and is being seen today for possible UTI. ? ?HPI: Urinary Tract Infection  ?This is a new problem. The current episode started in the past 7 days. The problem occurs every urination. The problem has been gradually worsening. The quality of the pain is described as burning, aching and stabbing. The pain is mild. There has been no fever. Associated symptoms include frequency, hematuria, hesitancy and urgency. Pertinent negatives include no chills, discharge, flank pain, nausea, possible pregnancy, sweats or vomiting. She has tried increased fluids for the symptoms. The treatment provided no relief.   ? ? ?Problems:  ?Patient Active Problem List  ? Diagnosis Date Noted  ? Loud snoring 08/18/2021  ? Menorrhagia 10/30/2019  ? Abnormal uterine bleeding (AUB) 10/30/2019  ? Migraine with aura   ?  ?Allergies:  ?Allergies  ?Allergen Reactions  ? Benylin Adult Formula [Dextromethorphan] Shortness Of Breath  ? Amoxicillin Hives, Itching and Swelling  ? Benadryl [  Diphenhydramine Hcl] Hives  ? Cefdinir Hives  ? Codeine Itching  ? Tdap [Tetanus-Diphth-Acell Pertussis]   ? Tramadol Itching  ? ?Medications:  ?Current Outpatient Medications:  ?  fluconazole (DIFLUCAN) 150 MG tablet, Take 1 tablet (150 mg total) by mouth every 3 (three) days as needed., Disp: 2 tablet, Rfl: 0 ?  sulfamethoxazole-trimethoprim (BACTRIM DS) 800-160 MG tablet, Take 1 tablet by mouth 2 (two) times daily., Disp: 10 tablet, Rfl: 0 ?  amitriptyline (ELAVIL) 25 MG tablet, TAKE 1 TABLET BY  MOUTH EVERYDAY AT BEDTIME, Disp: 90 tablet, Rfl: 1 ?  Erenumab-aooe (AIMOVIG) 70 MG/ML SOAJ, Inject 70 mg into the skin every 30 (thirty) days., Disp: 1.12 mL, Rfl: 3 ?  ondansetron (ZOFRAN) 4 MG tablet, Take 1 tablet (4 mg total) by mouth every 8 (eight) hours as needed for nausea or vomiting., Disp: 20 tablet, Rfl: 2 ? ?Observations/Objective: ?Patient is well-developed, well-nourished in no acute distress.  ?Resting comfortably at home.  ?Head is normocephalic, atraumatic.  ?No labored breathing.  ?Speech is clear and coherent with logical content.  ?Patient is alert and oriented at baseline.  ? ? ?Assessment and Plan: ?1. Suspected UTI ?- sulfamethoxazole-trimethoprim (BACTRIM DS) 800-160 MG tablet; Take 1 tablet by mouth 2 (two) times daily.  Dispense: 10 tablet; Refill: 0 ? ?2. Antibiotic-induced yeast infection ?- fluconazole (DIFLUCAN) 150 MG tablet; Take 1 tablet (150 mg total) by mouth every 3 (three) days as needed.  Dispense: 2 tablet; Refill: 0 ? ?- Worsening symptoms.  ?- Will treat empirically with Bactrim ?- Continue to push fluids.  ?- Diflucan given as prophylaxis as patient tends to get vaginal yeast infections with antibiotic use ?- She is to seek in person evaluation if symptoms do not improve or if they worsen.  ? ? ?Follow Up Instructions: ?I discussed the assessment and treatment plan with the patient. The patient was provided an opportunity to ask questions and all were answered. The patient agreed with the plan and demonstrated an understanding of the instructions.  A copy of instructions were sent to the patient via MyChart unless otherwise noted below.  ? ? ?The patient was advised to call back or seek an in-person evaluation if the symptoms worsen or if the condition fails to improve as anticipated. ? ?Time:  ?I spent 12 minutes with the patient via telehealth technology discussing the above problems/concerns.   ? ?Margaretann Loveless, PA-C ?

## 2021-10-22 ENCOUNTER — Other Ambulatory Visit: Payer: Self-pay | Admitting: Adult Health

## 2021-10-22 DIAGNOSIS — G43109 Migraine with aura, not intractable, without status migrainosus: Secondary | ICD-10-CM

## 2021-10-31 ENCOUNTER — Telehealth: Payer: Self-pay | Admitting: Primary Care

## 2021-10-31 NOTE — Telephone Encounter (Signed)
I called the patient and she wants to know why she has had to wait over a month and no one has contacted her for a in lab sleep study and wants this completed  ASAP. Please advise.  ?

## 2021-11-08 NOTE — Telephone Encounter (Signed)
We must obtain a prior auth before the sleep study can be scheduled.  I am working on the PA & the pt will be scheduled once complete.

## 2021-11-18 ENCOUNTER — Telehealth: Payer: Self-pay | Admitting: Primary Care

## 2021-11-18 NOTE — Telephone Encounter (Signed)
Please see encounter from 10/31/2021 for further updates. Will close this encounter

## 2021-11-18 NOTE — Telephone Encounter (Signed)
Patient called requesting to speak with Beth's nurse- would not given any info. Please call back at [phone].

## 2021-11-18 NOTE — Telephone Encounter (Signed)
Called and spoke with patient who is calling to check on sleep study she says she has been waiting about 2 months for this. Advised her I will send message to [facility] pool and hopefully she will get an update on Monday  Please advise

## 2021-11-24 ENCOUNTER — Telehealth (INDEPENDENT_AMBULATORY_CARE_PROVIDER_SITE_OTHER): Payer: BC Managed Care – PPO | Admitting: Adult Health

## 2021-11-24 VITALS — Wt 278.0 lb

## 2021-11-24 DIAGNOSIS — R000 Tachycardia, unspecified: Secondary | ICD-10-CM

## 2021-11-24 DIAGNOSIS — G43109 Migraine with aura, not intractable, without status migrainosus: Secondary | ICD-10-CM | POA: Diagnosis not present

## 2021-11-24 DIAGNOSIS — E668 Other obesity: Secondary | ICD-10-CM | POA: Diagnosis not present

## 2021-11-24 MED ORDER — WEGOVY 1 MG/0.5ML ~~LOC~~ SOAJ: 1.0000 mg | SUBCUTANEOUS | 0 refills | Status: DC

## 2021-11-24 MED ORDER — AIMOVIG 140 MG/ML ~~LOC~~ SOAJ: 140.0000 mg | SUBCUTANEOUS | 6 refills | Status: DC

## 2021-11-24 MED ORDER — NURTEC 75 MG PO TBDP: 75.0000 mg | ORAL_TABLET | Freq: Every day | ORAL | 0 refills | Status: DC | PRN

## 2021-11-24 NOTE — Progress Notes (Signed)
Virtual Visit via Video Note  I connected with Prince Rome on 11/24/21 at  4:15 PM EDT by a video enabled telemedicine application and verified that I am speaking with the correct person using two identifiers.  Location patient: home Location provider:work or home office Persons participating in the virtual visit: patient, provider  I discussed the limitations of evaluation and management by telemedicine and the availability of in person appointments. The patient expressed understanding and agreed to proceed.   HPI: She is being evaluated today for 48-month follow-up regarding migraine headaches and obesity/weight loss management.  Migraine  headaches-has been on Aimovig 70 mg for the last 4 months.  Reports excellent compliance and had decreased migraine headaches that were less severe.  Over the last few weeks she has had more frequent migraines that have become more severe.  She is not due for her next dose of Aimovig until July 4  Obesity/weight loss management  -Currently managed with Wegovy 0.5 mg weekly, she has been on this dose for roughly 3 months.  She continues to walk about 2 to 3 miles a day and has worked on eating a heart healthy diet.  Her portion sizes less since starting Wegovy.  She has lost roughly 19 pounds.  She does report that she has had elevated heart rate with readings in the low 100s up to 120-130.  Wt Readings from Last 3 Encounters:  11/24/21 278 lb (126.1 kg)  08/24/21 290 lb (131.5 kg)  08/18/21 293 lb 6.4 oz (133.1 kg)    ROS: See pertinent positives and negatives per HPI.  Past Medical History:  Diagnosis Date   Abnormal uterine bleeding    GERD (gastroesophageal reflux disease)    Iron (Fe) deficiency anemia    Migraine    Migraine with aura    Obesity    PONV (postoperative nausea and vomiting)     Past Surgical History:  Procedure Laterality Date   DILATION AND CURETTAGE, DIAGNOSTIC / THERAPEUTIC  2015   ROBOTIC ASSISTED LAPAROSCOPIC  HYSTERECTOMY AND SALPINGECTOMY Bilateral 10/30/2019   Procedure: XI ROBOTIC ASSISTED LAPAROSCOPIC HYSTERECTOMY AND SALPINGECTOMY;  Surgeon: Olivia Mackie, MD;  Location: [facility];  Service: Gynecology;  Laterality: Bilateral;  Tracie RNFA confirmed 10/13/19 CS   TUBAL LIGATION  2014   WISDOM TOOTH EXTRACTION      Family History  Problem Relation Age of Onset   Diabetes Mother    Heart disease Mother    Diabetes Father    Kidney disease Father    Asthma Sister    Seizures Sister    Asthma Daughter    Von Willebrand disease Son    Asthma Sister    Diabetes Maternal Grandmother    Heart disease Maternal Grandmother    Diabetes Maternal Grandfather    Diabetes Paternal Grandmother    Lupus Paternal Grandmother    Diabetes Paternal Grandfather        Current Outpatient Medications:    amitriptyline (ELAVIL) 25 MG tablet, TAKE 1 TABLET BY MOUTH EVERYDAY AT BEDTIME, Disp: 90 tablet, Rfl: 1   fluconazole (DIFLUCAN) 150 MG tablet, Take 1 tablet (150 mg total) by mouth every 3 (three) days as needed., Disp: 2 tablet, Rfl: 0   ondansetron (ZOFRAN) 4 MG tablet, Take 1 tablet (4 mg total) by mouth every 8 (eight) hours as needed for nausea or vomiting., Disp: 20 tablet, Rfl: 2   sulfamethoxazole-trimethoprim (BACTRIM DS) 800-160 MG tablet, Take 1 tablet by mouth 2 (two) times daily., Disp: 10  tablet, Rfl: 0   WEGOVY 0.5 MG/0.5ML SOAJ, Inject 0.5 mg into the skin once a week., Disp: , Rfl:   EXAM:  VITALS per patient if applicable:  GENERAL: alert, oriented, appears well and in no acute distress  HEENT: atraumatic, conjunttiva clear, no obvious abnormalities on inspection of external nose and ears  NECK: normal movements of the head and neck  LUNGS: on inspection no signs of respiratory distress, breathing rate appears normal, no obvious gross SOB, gasping or wheezing  CV: no obvious cyanosis  MS: moves all visible extremities without noticeable  abnormality  PSYCH/NEURO: pleasant and cooperative, no obvious depression or anxiety, speech and thought processing grossly intact  ASSESSMENT AND PLAN:  Discussed the following assessment and plan:  1. Migraine with aura and without status migrainosus, not intractable-  - Will increase Aimovig to 140 mg monthly.  - Send in Nurtec to breakthrough migraines - Erenumab-aooe (AIMOVIG) 140 MG/ML SOAJ; Inject 140 mg into the skin every 30 (thirty) days.  Dispense: 1.12 mL; Refill: 6 - Rimegepant Sulfate (NURTEC) 75 MG TBDP; Take 75 mg by mouth daily as needed.  Dispense: 16 tablet; Refill: 0  2. Other obesity  - Semaglutide-Weight Management (WEGOVY) 1 MG/0.5ML SOAJ; Inject 1 mg into the skin once a week.  Dispense: 6 mL; Refill: 0  3. Tachycardia - Will continue to monitor. There have been some reports of tachycardia from [facility]. May need to stop Wegovy        I discussed the assessment and treatment plan with the patient. The patient was provided an opportunity to ask questions and all were answered. The patient agreed with the plan and demonstrated an understanding of the instructions.   The patient was advised to call back or seek an in-person evaluation if the symptoms worsen or if the condition fails to improve as anticipated.   Shirline Frees, NP

## 2021-11-25 ENCOUNTER — Encounter: Payer: Self-pay | Admitting: Adult Health

## 2021-12-02 ENCOUNTER — Telehealth: Payer: Self-pay | Admitting: Adult Health

## 2021-12-02 NOTE — Telephone Encounter (Signed)
Spoke to patient and informed her that wegovy is likely causing her tachycardia. Will have her stop wegovy for the next few weeks and see if heart rate comes down to normal limits

## 2021-12-06 ENCOUNTER — Telehealth (INDEPENDENT_AMBULATORY_CARE_PROVIDER_SITE_OTHER): Payer: BC Managed Care – PPO | Admitting: Adult Health

## 2021-12-06 DIAGNOSIS — F32A Depression, unspecified: Secondary | ICD-10-CM

## 2021-12-06 DIAGNOSIS — F419 Anxiety disorder, unspecified: Secondary | ICD-10-CM | POA: Diagnosis not present

## 2021-12-06 MED ORDER — BUPROPION HCL ER (XL) 150 MG PO TB24: 150.0000 mg | ORAL_TABLET | Freq: Every day | ORAL | 0 refills | Status: DC

## 2021-12-06 MED ORDER — ALPRAZOLAM 0.25 MG PO TABS: 0.2500 mg | ORAL_TABLET | Freq: Two times a day (BID) | ORAL | 0 refills | Status: DC | PRN

## 2021-12-06 NOTE — Progress Notes (Signed)
Virtual Visit via Video Note  I connected with Lori [name] on 12/06/21 at  7:30 AM EDT by a video enabled telemedicine application and verified that I am speaking with the correct person using two identifiers.  Location patient: home Location provider:work or home office Persons participating in the virtual visit: patient, provider  I discussed the limitations of evaluation and management by telemedicine and the availability of in person appointments. The patient expressed understanding and agreed to proceed.   HPI: 31 year old female who  has a past medical history of Abnormal uterine bleeding, GERD (gastroesophageal reflux disease), Iron (Fe) deficiency anemia, Migraine, Migraine with aura, Obesity, and PONV (postoperative nausea and vomiting).  She is being evaluated today for recurrent anxiety and depression. She reports that her symptoms started last week while at work. She cannot place a trigger or event that caused her symptoms. She reports feeling like she is boxed in and this is causing her anxiety and depression. She was in therapy last year for the same symptoms and found it helpful to some degree. She is interested in starting medication therapy as well as seeing a new therapist.   She had paperwork for her job faxed over to [place].    ROS: See pertinent positives and negatives per HPI.  Past Medical History:  Diagnosis Date   Abnormal uterine bleeding    GERD (gastroesophageal reflux disease)    Iron (Fe) deficiency anemia    Migraine    Migraine with aura    Obesity    PONV (postoperative nausea and vomiting)     Past Surgical History:  Procedure Laterality Date   DILATION AND CURETTAGE, DIAGNOSTIC / THERAPEUTIC  2015   ROBOTIC ASSISTED LAPAROSCOPIC HYSTERECTOMY AND SALPINGECTOMY Bilateral 10/30/2019   Procedure: XI ROBOTIC ASSISTED LAPAROSCOPIC HYSTERECTOMY AND SALPINGECTOMY;  Surgeon: Olivia Mackie, MD;  Location: [facility] [place];  Service: Gynecology;   Laterality: Bilateral;  Tracie RNFA confirmed 10/13/19 CS   TUBAL LIGATION  2014   WISDOM TOOTH EXTRACTION      Family History  Problem Relation Age of Onset   Diabetes Mother    Heart disease Mother    Diabetes Father    Kidney disease Father    Asthma Sister    Seizures Sister    Asthma Daughter    Von Willebrand disease Son    Asthma Sister    Diabetes Maternal Grandmother    Heart disease Maternal Grandmother    Diabetes Maternal Grandfather    Diabetes Paternal Grandmother    Lupus Paternal Grandmother    Diabetes Paternal Grandfather        Current Outpatient Medications:    amitriptyline (ELAVIL) 25 MG tablet, TAKE 1 TABLET BY MOUTH EVERYDAY AT BEDTIME, Disp: 90 tablet, Rfl: 1   Erenumab-aooe (AIMOVIG) 140 MG/ML SOAJ, Inject 140 mg into the skin every 30 (thirty) days., Disp: 1.12 mL, Rfl: 6   fluconazole (DIFLUCAN) 150 MG tablet, Take 1 tablet (150 mg total) by mouth every 3 (three) days as needed., Disp: 2 tablet, Rfl: 0   ondansetron (ZOFRAN) 4 MG tablet, Take 1 tablet (4 mg total) by mouth every 8 (eight) hours as needed for nausea or vomiting., Disp: 20 tablet, Rfl: 2   Rimegepant Sulfate (NURTEC) 75 MG TBDP, Take 75 mg by mouth daily as needed., Disp: 16 tablet, Rfl: 0   Semaglutide-Weight Management (WEGOVY) 1 MG/0.5ML SOAJ, Inject 1 mg into the skin once a week., Disp: 6 mL, Rfl: 0   sulfamethoxazole-trimethoprim (BACTRIM DS) 800-160  MG tablet, Take 1 tablet by mouth 2 (two) times daily., Disp: 10 tablet, Rfl: 0  EXAM:  VITALS per patient if applicable:  GENERAL: alert, oriented, appears well and in no acute distress  HEENT: atraumatic, conjunttiva clear, no obvious abnormalities on inspection of external nose and ears  NECK: normal movements of the head and neck  LUNGS: on inspection no signs of respiratory distress, breathing rate appears normal, no obvious gross SOB, gasping or wheezing  CV: no obvious cyanosis  MS: moves all visible extremities  without noticeable abnormality  PSYCH/NEURO: pleasant and cooperative, no obvious depression or anxiety, speech and thought processing grossly intact  ASSESSMENT AND PLAN:  Discussed the following assessment and plan:  1. Anxiety and depression - Will start on Wellbutrin 150 mg XR and Xanax 0.25 mg PRN. We did discuss side effects of these medications  - buPROPion (WELLBUTRIN XL) 150 MG 24 hr tablet; Take 1 tablet (150 mg total) by mouth daily.  Dispense: 90 tablet; Refill: 0 - ALPRAZolam (XANAX) 0.25 MG tablet; Take 1 tablet (0.25 mg total) by mouth 2 (two) times daily as needed for anxiety.  Dispense: 20 tablet; Refill: 0 - Information given on local therapist that she will contact - Follow up in one month or sooner if needed  I discussed the assessment and treatment plan with the patient. The patient was provided an opportunity to ask questions and all were answered. The patient agreed with the plan and demonstrated an understanding of the instructions.   The patient was advised to call back or seek an in-person evaluation if the symptoms worsen or if the condition fails to improve as anticipated.   Shirline Frees, NP

## 2021-12-08 NOTE — Telephone Encounter (Signed)
PA is in progress. 

## 2021-12-13 ENCOUNTER — Telehealth: Payer: Self-pay | Admitting: Adult Health

## 2021-12-14 ENCOUNTER — Telehealth: Payer: Self-pay

## 2021-12-14 NOTE — Telephone Encounter (Signed)
Pt called asking if Lori [name] could return her call. Pt had some questions regarding a fax we received for her from [employer].  [phone]  Please advise.

## 2021-12-14 NOTE — Telephone Encounter (Signed)
Per front office staff pt called regarding a phone call she received from [employer]. Called pt back and she stated that [employer] faxed over FMLA form for pt for Anxiety and depression and is wanting all ov notes sent over from the month of June. Pt stated that she is still having anxiety and depression and wanting to know how long does the medication take to start working. Pt also requesting to talk to [facility] about the paperwork. Pt also stated she will drop off another form that need to be filled out for her physical she just had for insurance purposes. Pt advised Kandee Keen is out of the office but I will send this message to him along with the FMLA ppw once I receive it. Pt stated that PPW was faxed over with confirmation per [employer]. Advised pt that I will be on the lookout. Pt verbalized understanding.

## 2021-12-14 NOTE — Telephone Encounter (Signed)
Lori [name] ([employer] Baker) Rx #: [id number] Aimovig 140MG /ML auto-injectors   Form [employer] Electronic PA Form [phone] NCPDP) Created 20 days ago Sent to Plan 2 minutes ago Plan Response 2 minutes ago Submit Clinical Questions less than a minute ago Determination Wait for Determination Please wait for [employer] NCPDP 2017 to return a determination.

## 2021-12-15 NOTE — Telephone Encounter (Signed)
I will check on this tomorrow.

## 2021-12-15 NOTE — Telephone Encounter (Signed)
Please advise if you have an update on the PA.

## 2021-12-16 NOTE — Telephone Encounter (Signed)
PA requests still pending

## 2021-12-16 NOTE — Telephone Encounter (Signed)
Patient notified of update  and verbalized understanding. PPW faxed with confirmation.

## 2021-12-18 ENCOUNTER — Other Ambulatory Visit: Payer: Self-pay | Admitting: Adult Health

## 2021-12-18 DIAGNOSIS — G43109 Migraine with aura, not intractable, without status migrainosus: Secondary | ICD-10-CM

## 2021-12-28 ENCOUNTER — Telehealth: Payer: Self-pay

## 2021-12-28 NOTE — Telephone Encounter (Signed)
Called pt to schedule an ov for FMLA PPW per [facility]. Pt is also due for monthly f/u.

## 2021-12-30 ENCOUNTER — Telehealth (INDEPENDENT_AMBULATORY_CARE_PROVIDER_SITE_OTHER): Payer: BC Managed Care – PPO | Admitting: Adult Health

## 2021-12-30 ENCOUNTER — Telehealth: Payer: Self-pay | Admitting: Adult Health

## 2021-12-30 ENCOUNTER — Encounter: Payer: Self-pay | Admitting: Adult Health

## 2021-12-30 DIAGNOSIS — F419 Anxiety disorder, unspecified: Secondary | ICD-10-CM | POA: Diagnosis not present

## 2021-12-30 DIAGNOSIS — F32A Depression, unspecified: Secondary | ICD-10-CM | POA: Diagnosis not present

## 2021-12-30 NOTE — Telephone Encounter (Signed)
Spoke to [place] and advised that we were waiting on her to return the call to complete FMLA. Pt has been scheduled for today.

## 2021-12-30 NOTE — Telephone Encounter (Signed)
Pt called to ask when will Lori [name] complete her paperwork. She stated its been faxed several times since last week and she is on a deadline. Form was received via [employer] and saved to [name]'s folder at 8:42 am on 12/30/21.  Please advise.

## 2021-12-30 NOTE — Progress Notes (Signed)
Virtual Visit via Telephone Note  I connected with Lori [name] on 12/30/21 at 11:30 AM EDT by telephone and verified that I am speaking with the correct person using two identifiers.   I discussed the limitations, risks, security and privacy concerns of performing an evaluation and management service by telephone and the availability of in person appointments. I also discussed with the patient that there may be a patient responsible charge related to this service. The patient expressed understanding and agreed to proceed.  Location patient: home Location provider: work or home office Participants present for the call: patient, provider Patient did not have a visit in the prior 7 days to address this/these issue(s).   History of Present Illness: 31 year old female is being evaluated today for follow-up regarding anxiety and depression.  We received some short-term disability/FMLA paperwork that needed clarification.  She reports that if she is starting to feel little bit better since starting the Wellbutrin, this is week 3 of being on the medication.  She does plan to follow-up next week to discuss further.  She continues to have issues with anxiety and depression, is not leaving the house often and is having trouble completing her ADLs.  I did give her information for a local therapist but it would be cash pay and the patient cannot afford this right now.  She is interested in still seeking therapy as she has gone through this in the past and found that therapy was beneficial.   Observations/Objective: Patient sounds cheerful and well on the phone. I do not appreciate any SOB. Speech and thought processing are grossly intact. Patient reported vitals:  Assessment and Plan: 1. Anxiety and depression  - Ambulatory referral to Psychiatry   Follow Up Instructions:  I did not refer this patient for an OV in the next 24 hours for this/these issue(s).  I discussed the assessment and  treatment plan with the patient. The patient was provided an opportunity to ask questions and all were answered. The patient agreed with the plan and demonstrated an understanding of the instructions.   The patient was advised to call back or seek an in-person evaluation if the symptoms worsen or if the condition fails to improve as anticipated.  I provided 16 minutes of non-face-to-face time during this encounter.   Shirline Frees, NP

## 2022-01-03 ENCOUNTER — Other Ambulatory Visit: Payer: Self-pay | Admitting: Adult Health

## 2022-01-03 ENCOUNTER — Encounter: Payer: Self-pay | Admitting: Adult Health

## 2022-01-03 ENCOUNTER — Telehealth (INDEPENDENT_AMBULATORY_CARE_PROVIDER_SITE_OTHER): Payer: BC Managed Care – PPO | Admitting: Adult Health

## 2022-01-03 VITALS — HR 120 | Ht 64.0 in | Wt 270.0 lb

## 2022-01-03 DIAGNOSIS — R000 Tachycardia, unspecified: Secondary | ICD-10-CM

## 2022-01-03 DIAGNOSIS — F321 Major depressive disorder, single episode, moderate: Secondary | ICD-10-CM

## 2022-01-03 DIAGNOSIS — R600 Localized edema: Secondary | ICD-10-CM

## 2022-01-03 DIAGNOSIS — R002 Palpitations: Secondary | ICD-10-CM

## 2022-01-03 NOTE — Progress Notes (Signed)
Virtual Visit via Telephone Note  I connected with Lori [name] on 01/03/22 at  4:00 PM EDT by telephone and verified that I am speaking with the correct person using two identifiers.   I discussed the limitations, risks, security and privacy concerns of performing an evaluation and management service by telephone and the availability of in person appointments. I also discussed with the patient that there may be a patient responsible charge related to this service. The patient expressed understanding and agreed to proceed.  Location patient: home Location provider: work or home office Participants present for the call: patient, provider Patient did not have a visit in the prior 7 days to address this/these issue(s).   History of Present Illness: Anxiety and Depression - she was placed on Wellbutrin 150 mg XR about a month ago. She reports that she has not noticed any improvement in her symptoms and she continues to feel depressed and has anxiety with panic attacks. She has been referred to behavioral health but has not heard from them to make an appointment yet   Tachycardia - We had her stop wegovy three weeks ago due to concern of it causing an elevated heart rate.  She reports that her heart rate continues to be elevated in 114-160 range. She does feel palpitations. Denies taking any additional supplements   Additionally, she reports that over the last week she has had swelling to her right leg with numbness and tingling as well as calf tenderness.  She does elevate her leg and notices some mild improvement but the swelling discomfort does not go away.   Observations/Objective: Patient sounds cheerful and well on the phone. I do not appreciate any SOB. Speech and thought processing are grossly intact. Patient reported vitals:  Assessment and Plan: 1. Tachycardia  - Ambulatory referral to Cardiology - Cardiac event monitor; Future  2. Depression, major, single episode,  moderate (HCC) - Will d/c Wellburin since we are not getting any benefit from it.  - Will hold off on SSRI until seen by cardiology   3. Lower extremity edema -I cannot evaluate this via phone visit.  There is concern for DVT.  She was advised to go to the nearest emergency room which she agreed with.   Follow Up Instructions:   I did not refer this patient for an OV in the next 24 hours for this/these issue(s).  I discussed the assessment and treatment plan with the patient. The patient was provided an opportunity to ask questions and all were answered. The patient agreed with the plan and demonstrated an understanding of the instructions.   The patient was advised to call back or seek an in-person evaluation if the symptoms worsen or if the condition fails to improve as anticipated.  I provided 23 minutes of non-face-to-face time during this encounter.   Shirline Frees, NP       EXAM:  VITALS per patient if applicable:  GENERAL: alert, oriented, appears well and in no acute distress  HEENT: atraumatic, conjunttiva clear, no obvious abnormalities on inspection of external nose and ears  NECK: normal movements of the head and neck  LUNGS: on inspection no signs of respiratory distress, breathing rate appears normal, no obvious gross SOB, gasping or wheezing  CV: no obvious cyanosis  MS: moves all visible extremities without noticeable abnormality  PSYCH/NEURO: pleasant and cooperative, no obvious depression or anxiety, speech and thought processing grossly intact  ASSESSMENT AND PLAN:  Discussed the following assessment and plan:  No  diagnosis found.     I discussed the assessment and treatment plan with the patient. The patient was provided an opportunity to ask questions and all were answered. The patient agreed with the plan and demonstrated an understanding of the instructions.   The patient was advised to call back or seek an in-person evaluation if the  symptoms worsen or if the condition fails to improve as anticipated.   Shirline Frees, NP

## 2022-01-04 ENCOUNTER — Encounter: Payer: Self-pay | Admitting: Cardiovascular Disease

## 2022-01-04 ENCOUNTER — Ambulatory Visit (INDEPENDENT_AMBULATORY_CARE_PROVIDER_SITE_OTHER): Payer: BC Managed Care – PPO | Admitting: Cardiovascular Disease

## 2022-01-04 ENCOUNTER — Encounter: Payer: Self-pay | Admitting: Adult Health

## 2022-01-04 ENCOUNTER — Telehealth: Payer: Self-pay | Admitting: Adult Health

## 2022-01-04 VITALS — BP 122/80 | HR 98 | Ht 64.0 in | Wt 285.2 lb

## 2022-01-04 DIAGNOSIS — R000 Tachycardia, unspecified: Secondary | ICD-10-CM

## 2022-01-04 MED ORDER — METOPROLOL SUCCINATE ER 25 MG PO TB24: 25.0000 mg | ORAL_TABLET | Freq: Every day | ORAL | 3 refills | Status: DC

## 2022-01-04 NOTE — Progress Notes (Signed)
Cardiology Office Note:    Date:  01/04/2022   ID:  Lori [name], DOB 1990-11-12, MRN [id number]  PCP:  Shirline Frees, NP   [facility] Providers Cardiologist:  Mariapaula Krist   Referring MD: Shirline Frees, NP   Chief Complaint  Patient presents with   Palpitations     History of Present Illness:    Lori [name] is a 31 y.o. female with a hx of dyspnea with exertion And fast HR , obesity .   Walks regularly . 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     Past Medical History:  Diagnosis Date   Abnormal uterine bleeding    GERD (gastroesophageal reflux disease)    Iron (Fe) deficiency anemia    Migraine    Migraine with aura    Obesity    PONV (postoperative nausea and vomiting)     Past Surgical History:  Procedure Laterality Date   DILATION AND CURETTAGE, DIAGNOSTIC / THERAPEUTIC  2015   ROBOTIC ASSISTED LAPAROSCOPIC HYSTERECTOMY AND SALPINGECTOMY Bilateral 10/30/2019   Procedure: XI ROBOTIC ASSISTED LAPAROSCOPIC HYSTERECTOMY AND SALPINGECTOMY;  Surgeon: Olivia Mackie, MD;  Location: [facility];  Service: Gynecology;  Laterality: Bilateral;  Tracie RNFA confirmed 10/13/19 CS   TUBAL LIGATION  2014   WISDOM TOOTH EXTRACTION      Current Medications: Current Meds  Medication Sig   AIMOVIG 140 MG/ML SOAJ INJECT 140 MG INTO THE SKIN EVERY 30 DAYS   ALPRAZolam (XANAX) 0.25 MG tablet Take 1 tablet (0.25 mg total) by mouth 2 (two) times daily as needed for anxiety.   amitriptyline (ELAVIL) 25 MG tablet TAKE 1 TABLET BY MOUTH EVERYDAY AT BEDTIME   fluconazole  (DIFLUCAN) 150 MG tablet Take 1 tablet (150 mg total) by mouth every 3 (three) days as needed.   ondansetron (ZOFRAN) 4 MG tablet Take 1 tablet (4 mg total) by mouth every 8 (eight) hours as needed for nausea or vomiting.   Rimegepant Sulfate (NURTEC) 75 MG TBDP Take 75 mg by mouth daily as needed.     Allergies:   Benylin adult formula [dextromethorphan], Amoxicillin, Benadryl [diphenhydramine hcl], Cefdinir, Tdap [tetanus-diphth-acell pertussis], Codeine, and Tramadol   Social History   Socioeconomic History   Marital status: Single    Spouse name: Not on file   Number of children: Not on file   Years of education: Not on file   Highest education level: Associate degree: occupational, Scientist, product/process development, or vocational program  Occupational History   Not on file  Tobacco Use   Smoking status: Never   Smokeless tobacco: Never  Vaping Use   Vaping Use: Never used  Substance and Sexual Activity   Alcohol use: Not Currently    Comment: social   Drug use: No   Sexual activity: Yes    Partners: Male    Birth control/protection: Surgical  Other Topics Concern   Not on file  Social History Narrative   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   Two children ( daughter  8 , son 3)       Social Determinants of Health   Financial Resource Strain: Low Risk  (08/22/2021)   Overall Financial Resource Strain (CARDIA)    Difficulty of Paying Living Expenses: Not hard at all  Food Insecurity: No Food Insecurity (08/22/2021)   Hunger Vital Sign    Worried About Running Out of Food in the Last Year: Never true    Ran Out of Food in the Last Year: Never true  Transportation Needs: No Transportation Needs (08/22/2021)   PRAPARE - Administrator, Civil Service (Medical): No    Lack of Transportation (Non-Medical): No  Physical Activity: Insufficiently Active (08/22/2021)   Exercise Vital Sign    Days of Exercise per Week: 3 days    Minutes of Exercise per Session: 30 min  Stress: No Stress  Concern Present (08/22/2021)   [employer] of Occupational Health - Occupational Stress Questionnaire    Feeling of Stress : Not at all  Social Connections: Moderately Isolated (08/22/2021)   Social Connection and Isolation Panel [NHANES]    Frequency of Communication with Friends and Family: More than three times a week    Frequency of Social Gatherings with Friends and Family: Once a week    Attends Religious Services: More than 4 times per year    Active Member of [employer] or Organizations: No    Attends Engineer, structural: Not on file    Marital Status: Never married     Family History: The patient's family history includes Asthma in her daughter, sister, and sister; Diabetes in her father, maternal grandfather, maternal grandmother, mother, paternal grandfather, and paternal grandmother; Heart disease in her maternal grandmother and mother; Kidney disease in her father; Lupus in her paternal grandmother; Seizures in her sister; Von Willebrand disease in her son.  ROS:   Please see the history of present illness.     All other systems reviewed and are negative.  EKGs/Labs/Other Studies Reviewed:    The following studies were reviewed today:   EKG:  January 04, 2022 NSR,  98, no ST or T wave changes.   Recent Labs: 07/28/2021: ALT 16; BUN 9; Creatinine, Ser 0.71; Hemoglobin 13.1; Platelets 333.0; Potassium 3.7; Sodium 141; TSH 1.20  Recent Lipid Panel    Component Value Date/Time   CHOL 180 07/28/2021 1001   TRIG 71.0 07/28/2021 1001   HDL 46.90 07/28/2021 1001   CHOLHDL 4 07/28/2021 1001   VLDL 14.2 07/28/2021 1001   LDLCALC 119 (H) 07/28/2021 1001     Risk Assessment/Calculations:          Physical Exam:    VS:  BP 122/80   Pulse 98   Ht 5\' 4"  (1.626 m)   Wt 285 lb 3.2 oz (129.4 kg)   LMP 10/18/2019 Comment: HYSTERECTOMY 10/18/2019  SpO2 98%   BMI 48.95 kg/m     Wt Readings from Last 3 Encounters:  01/04/22 285 lb 3.2 oz (129.4 kg)  01/03/22 270 lb  (122.5 kg)  11/24/21 278 lb (126.1 kg)     GEN:  Well nourished, well developed in no acute distress HEENT: Normal NECK: No JVD; No carotid bruits LYMPHATICS: No lymphadenopathy CARDIAC: RRR, no murmurs, rubs, gallops RESPIRATORY:  Clear to auscultation without rales, wheezing or rhonchi  ABDOMEN: Soft, non-tender, non-distended MUSCULOSKELETAL:  No edema; No deformity  SKIN: Warm and dry NEUROLOGIC:  Alert and oriented x 3 PSYCHIATRIC:  Normal affect   ASSESSMENT:    No diagnosis  found. PLAN:    In order of problems listed above:  Tachycardia :   Derek presents for further evaluation of some tachycardia that started several months ago.  Of note is that she started [facility] about 5 months ago.  She describes shortness of breath with exertion.  Her resting heart rate is typically in the 110s.  She occasionally seen 140-160.  She has an Scientist, physiological and I encouraged her to take an EKG when her heart rate is sustained in the 140 or 160 range.  It is possible that she has episodes of supraventricular tachycardia but at present I think this is just sinus tachycardia caused by [facility].  We will start her on Toprol-XL 25 mg a day.  I encouraged her to stay hydrated including adding electrolytes to her drinking water.  She does not drink caffeine.  I encouraged her to continue with her weight loss efforts.  I do not think that she will be able to tolerate the current dose of Wegovy.  It is possible that she could tolerate a lower dose but I will leave that up to her and her medical doctor.  We will get an echocardiogram for further evaluation of her cardiac function and valvular function.   2.  Heart murmur: She has a very soft systolic murmur.  We will get an echocardiogram for further evaluation.            Medication Adjustments/Labs and Tests Ordered: Current medicines are reviewed at length with the patient today.  Concerns regarding medicines are outlined above.  No orders of  the defined types were placed in this encounter.  No orders of the defined types were placed in this encounter.   There are no Patient Instructions on file for this visit.   Signed, Kristeen Miss, MD  01/04/2022 9:22 AM    [facility]

## 2022-01-04 NOTE — Telephone Encounter (Signed)
Pt called wanting a letter to be out of work until she gets better. Informed [name] verbally. Letter has been placed for Pt ok'd by [employer]. Pt notified of update.

## 2022-01-04 NOTE — Patient Instructions (Signed)
Medication Instructions:  START Toprol Xl 25mg  daily(metoprolol succinate) *If you need a refill on your cardiac medications before your next appointment, please call your pharmacy*   Lab Work: NONE If you have labs (blood work) drawn today and your tests are completely normal, you will receive your results only by: MyChart Message (if you have MyChart) OR A paper copy in the mail If you have any lab test that is abnormal or we need to change your treatment, we will call you to review the results.   Testing/Procedures: ECHO Your physician has requested that you have an echocardiogram. Echocardiography is a painless test that uses sound waves to create images of your heart. It provides your doctor with information about the size and shape of your heart and how well your heart's chambers and valves are working. This procedure takes approximately one hour. There are no restrictions for this procedure.  Follow-Up: At [facility], you and your health needs are our priority.  As part of our continuing mission to provide you with exceptional heart care, we have created designated Provider Care Teams.  These Care Teams include your primary Cardiologist (physician) and Advanced Practice Providers (APPs -  Physician Assistants and Nurse Practitioners) who all work together to provide you with the care you need, when you need it.  We recommend signing up for the patient portal called "MyChart".  Sign up information is provided on this After Visit Summary.  MyChart is used to connect with patients for Virtual Visits (Telemedicine).  Patients are able to view lab/test results, encounter notes, upcoming appointments, etc.  Non-urgent messages can be sent to your provider as well.   To learn more about what you can do with MyChart, go to [facility].    Your next appointment:   3 month(s)  The format for your next appointment:   In Person  Provider:   [email or web address] or Tereso Newcomer  {    Important Information About Sugar

## 2022-01-04 NOTE — Telephone Encounter (Signed)
Pt had a virtual with [facility] yesterday and forgot to ask him about the [facility] paperwork.  Pt is requesting a call back.  [phone]

## 2022-01-05 NOTE — Telephone Encounter (Signed)
Checked patient's chart. She has been scheduled for a sleep study on 02/01/22. Will close encounter.

## 2022-01-14 ENCOUNTER — Ambulatory Visit: Payer: BC Managed Care – PPO | Attending: Adult Health

## 2022-01-14 DIAGNOSIS — R002 Palpitations: Secondary | ICD-10-CM

## 2022-01-14 DIAGNOSIS — R000 Tachycardia, unspecified: Secondary | ICD-10-CM

## 2022-01-25 ENCOUNTER — Ambulatory Visit (HOSPITAL_BASED_OUTPATIENT_CLINIC_OR_DEPARTMENT_OTHER): Payer: BC Managed Care – PPO | Admitting: Psychiatry

## 2022-01-25 VITALS — BP 141/90 | HR 92 | Wt 278.0 lb

## 2022-01-25 DIAGNOSIS — F330 Major depressive disorder, recurrent, mild: Secondary | ICD-10-CM

## 2022-01-25 MED ORDER — FLUOXETINE HCL 10 MG PO CAPS: 10.0000 mg | ORAL_CAPSULE | Freq: Every day | ORAL | 1 refills | Status: DC

## 2022-01-25 MED ORDER — HYDROXYZINE PAMOATE 25 MG PO CAPS: 25.0000 mg | ORAL_CAPSULE | Freq: Every evening | ORAL | 1 refills | Status: DC | PRN
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
## 2022-01-25 NOTE — Patient Instructions (Signed)
Follow-up in 4 weeks

## 2022-01-26 ENCOUNTER — Ambulatory Visit (HOSPITAL_COMMUNITY): Payer: BC Managed Care – PPO

## 2022-02-01 ENCOUNTER — Ambulatory Visit (HOSPITAL_BASED_OUTPATIENT_CLINIC_OR_DEPARTMENT_OTHER): Payer: Medicaid Other | Attending: Primary Care | Admitting: Pulmonary Disease

## 2022-02-01 DIAGNOSIS — R0683 Snoring: Secondary | ICD-10-CM | POA: Insufficient documentation

## 2022-02-01 DIAGNOSIS — R400 Somnolence: Secondary | ICD-10-CM | POA: Insufficient documentation

## 2022-02-09 ENCOUNTER — Ambulatory Visit (HOSPITAL_COMMUNITY): Payer: Medicaid Other | Attending: Cardiology

## 2022-02-09 DIAGNOSIS — R000 Tachycardia, unspecified: Secondary | ICD-10-CM | POA: Insufficient documentation

## 2022-02-09 DIAGNOSIS — R0683 Snoring: Secondary | ICD-10-CM

## 2022-02-09 LAB — ECHOCARDIOGRAM COMPLETE
Area-P 1/2: 4.39 cm2
S' Lateral: 3.2 cm

## 2022-02-09 NOTE — Procedures (Signed)
Patient Name: Lori [name], Lori [name] Date: 02/01/2022 Gender: Female D.O.B: 1990/09/04 Age (years): 31 Referring Provider: Ames Dura NP Height (inches): 64 Interpreting Physician: Cyril Mourning MD, ABSM Weight (lbs): 277 RPSGT: Shelah Lewandowsky BMI: 48 MRN: [id number] Neck Size: 15.50 <br> <br> CLINICAL INFORMATION Sleep Study Type: NPSG    Indication for sleep study: Depression, Morning Headaches, Obesity, Snoring, Witnesses Apnea / Gasping During Sleep    Epworth Sleepiness Score: 15    SLEEP STUDY TECHNIQUE As per the AASM Manual for the Scoring of Sleep and Associated Events v2.3 (April 2016) with a hypopnea requiring 4% desaturations.  The channels recorded and monitored were frontal, central and occipital EEG, electrooculogram (EOG), submentalis EMG (chin), nasal and oral airflow, thoracic and abdominal wall motion, anterior tibialis EMG, snore microphone, electrocardiogram, and pulse oximetry.  MEDICATIONS Medications self-administered by patient taken the night of the study : HYDROXYZINE, FLUOXETINE  SLEEP ARCHITECTURE The study was initiated at 10:14:11 PM and ended at 5:32:34 AM.  Sleep onset time was 1.8 minutes and the sleep efficiency was 96.1%%. The total sleep time was 421.1 minutes.  Stage REM latency was 141.0 minutes.  The patient spent 3.3%% of the night in stage N1 sleep, 78.0%% in stage N2 sleep, 1.1%% in stage N3 and 17.6% in REM.  Alpha intrusion was absent.  Supine sleep was 53.53%.  RESPIRATORY PARAMETERS The overall apnea/hypopnea index (AHI) was 2.3 per hour. There were 0 total apneas, including 0 obstructive, 0 central and 0 mixed apneas. There were 16 hypopneas and 9 RERAs.  The AHI during Stage REM sleep was 11.4 per hour.  AHI while supine was 4.3 per hour.  The mean oxygen saturation was 96.5%. The minimum SpO2 during sleep was 89.0%.  moderate snoring was noted during this study.  CARDIAC DATA The 2 lead EKG demonstrated  sinus rhythm. The mean heart rate was 102.6 beats per minute. Other EKG findings include: None.   LEG MOVEMENT DATA The total PLMS were 0 with a resulting PLMS index of 0.0. Associated arousal with leg movement index was 0.0 .  IMPRESSIONS - No significant obstructive sleep apnea occurred during this study (AHI = 2.3/h). Few events noted during supine REM sleep. - The patient had minimal or no oxygen desaturation during the study (Min O2 = 89.0%) - The patient snored with moderate snoring volume. - No cardiac abnormalities were noted during this study. - Clinically significant periodic limb movements did not occur during sleep. No significant associated arousals.   DIAGNOSIS - Hypersomnolence - no cause identified during this study   RECOMMENDATIONS - Investigate for other causes of hypersomnolence such as medications or narcolepsy - Avoid alcohol, sedatives and other CNS depressants that may worsen sleep apnea and disrupt normal sleep architecture. - Sleep hygiene should be reviewed to assess factors that may improve sleep quality. - Weight management and regular exercise should be initiated or continued if appropriate.    Cyril Mourning MD Board Certified in Sleep medicine

## 2022-02-10 NOTE — Progress Notes (Signed)
Sleep study on 02/01/22 was negative for dx sleep apnea. A few events were noted when sleeping on her back during REM sleep. Enc weight loss and avoid back sleeping position. Avoid alcohol or sedating medication at bedtime. She should follow up with me or PCP to look into other reasons for hypersomnia

## 2022-02-26 NOTE — Progress Notes (Unsigned)
@Patient  ID: , female    DOB: 1991/05/20, 31 y.o.   MRN: [id number]  No chief complaint on file.   Referring provider: [id number], NP  HPI: 31 year old female, never smoked.  Past medical history significant for abnormal uterine bleeding, hysterectomy, migraine, obesity.  Previous [facility] pulmonary encounter: 08/18/2021 Patient presents today for sleep consult. He has symptoms of loud snoring. This has been going on for several years. Her 28 year old daughter also snores and will be having a sleep study tomorrow. She has never had a sleep study. Typical bedtime is between 9:30-10:30pm. It does not take her long to fall asleep. She wakes up on average twice a night. She starts her day at 6:30am. Her weight is up 20 lbs, she was recently started on Wegovy to help with weight loss. She has some mild nausea symptoms but overall is tolerating medication. Denies cataplexy, narcolepsy or sleep walking.   Sleep questionnaire Symptoms-  Snoring, gasping for breath while sleeping  Prior sleep study- None Bedtime- 9:30-10:30pm Time to fall asleep- not long  Nocturnal awakenings- 2 times  Out of bed/start of day- 6:30am Weight changes- up 20 pounds  Do you operate heavy machinery- No Do you currently wear CPAP- No Do you current wear oxygen- No Epworth- 14  02/28/2022- Interim hx Patient presents today for follow-up. Patient was seen for sleep consult in March d/t loud snoring, suspected sleep apnea. She had polysomnography on 02/01/22 which showed no significant evidence of OSA, AHI 2.3/hour. Few events noted during supine REM sleep. Patient had minimal to no oxygen desaturations. Patient snored with moderate volume. No cardiac abnormalities or significant periodic limb movement. No identifiable cause for hypersomnolence.  We reviewed sleep study results today.  Recommend patient avoid sleeping directly on her back encourage weight loss efforts. She was taken off Wegovy by her PCP  d/t tachycardia. She has since seen cardiology and was started on metoprolol.  Plans to discuss restarting Wegovy with her primary care physician.    Allergies  Allergen Reactions   Benylin Adult Formula [Dextromethorphan] Shortness Of Breath   Amoxicillin Hives, Itching and Swelling   Benadryl [Diphenhydramine Hcl] Hives   Cefdinir Hives   Tdap [Tetanus-Diphth-Acell Pertussis]    Codeine Itching   Tramadol Itching    Immunization History  Administered Date(s) Administered   Influenza Inj Mdck Quad Pf 07/28/2021   Influenza,inj,Quad PF,6+ Mos 04/03/2017, 04/05/2018   Moderna Sars-Covid-2 Vaccination 12/04/2019, 01/02/2020   Tdap 04/03/2017    Past Medical History:  Diagnosis Date   Abnormal uterine bleeding    GERD (gastroesophageal reflux disease)    Iron (Fe) deficiency anemia    Migraine    Migraine with aura    Obesity    PONV (postoperative nausea and vomiting)     Tobacco History: Social History   Tobacco Use  Smoking Status Never  Smokeless Tobacco Never   Counseling given: Not Answered   Outpatient Medications Prior to Visit  Medication Sig Dispense Refill   AIMOVIG 140 MG/ML SOAJ INJECT 140 MG INTO THE SKIN EVERY 30 DAYS 1 mL 6   ALPRAZolam (XANAX) 0.25 MG tablet Take 1 tablet (0.25 mg total) by mouth 2 (two) times daily as needed for anxiety. 20 tablet 0   fluconazole (DIFLUCAN) 150 MG tablet Take 1 tablet (150 mg total) by mouth every 3 (three) days as needed. 2 tablet 0   FLUoxetine (PROZAC) 10 MG capsule Take 1 capsule (10 mg total) by mouth daily. 30 capsule 1  hydrOXYzine (VISTARIL) 25 MG capsule Take 1 capsule (25 mg total) by mouth at bedtime as needed for anxiety. 30 capsule 1   metoprolol succinate (TOPROL XL) 25 MG 24 hr tablet Take 1 tablet (25 mg total) by mouth daily. 90 tablet 3   ondansetron (ZOFRAN) 4 MG tablet Take 1 tablet (4 mg total) by mouth every 8 (eight) hours as needed for nausea or vomiting. 20 tablet 2   Rimegepant Sulfate  (NURTEC) 75 MG TBDP Take 75 mg by mouth daily as needed. 16 tablet 0   No facility-administered medications prior to visit.   Review of Systems  Review of Systems  Constitutional: Negative.   Respiratory: Negative.    Cardiovascular: Negative.    Physical Exam  BP 118/84 (BP Location: Right Arm, Patient Position: Sitting, Cuff Size: Large)   Pulse 98   Temp 99.4 F (37.4 C) (Oral)   Ht  (1.626 m)   Wt 287 lb 6.4 oz (130.4 kg)   LMP 10/18/2019 Comment: HYSTERECTOMY 10/18/2019  SpO2 99%   BMI 49.33 kg/m  Physical Exam Constitutional:      Appearance: Normal appearance.  HENT:     Head: Normocephalic and atraumatic.     Mouth/Throat:     Mouth: Mucous membranes are moist.     Pharynx: Oropharynx is clear.  Cardiovascular:     Rate and Rhythm: Normal rate and regular rhythm.  Pulmonary:     Effort: Pulmonary effort is normal.     Breath sounds: Normal breath sounds. No wheezing, rhonchi or rales.  Musculoskeletal:        General: Normal range of motion.  Skin:    General: Skin is warm and dry.  Neurological:     General: No focal deficit present.     Mental Status: She is alert and oriented to person, place, and time. Mental status is at baseline.  Psychiatric:        Mood and Affect: Mood normal.        Behavior: Behavior normal.        Thought Content: Thought content normal.        Judgment: Judgment normal.      Lab Results:  CBC    Component Value Date/Time   WBC 6.7 07/28/2021 1001   RBC 4.76 07/28/2021 1001   HGB 13.1 07/28/2021 1001   HGB 11.9 07/27/2017 1703   HCT 40.6 07/28/2021 1001   HCT 35.9 07/27/2017 1703   PLT 333.0 07/28/2021 1001   PLT 385 (H) 07/27/2017 1703   MCV 85.3 07/28/2021 1001   MCV 84 07/27/2017 1703   MCH 27.9 10/31/2019 0523   MCHC 32.3 07/28/2021 1001   RDW 13.9 07/28/2021 1001   RDW 13.6 07/27/2017 1703   LYMPHSABS 1.7 07/28/2021 1001   MONOABS 0.4 07/28/2021 1001   EOSABS 0.1 07/28/2021 1001   BASOSABS 0.1  07/28/2021 1001    BMET    Component Value Date/Time   NA 141 07/28/2021 1001   K 3.7 07/28/2021 1001   CL 105 07/28/2021 1001   CO2 31 07/28/2021 1001   GLUCOSE 92 07/28/2021 1001   BUN 9 07/28/2021 1001   CREATININE 0.71 07/28/2021 1001   CALCIUM 8.9 07/28/2021 1001    BNP No results found for: "BNP"  ProBNP No results found for: "PROBNP"  Imaging: CARDIAC EVENT MONITOR  Result Date: 02/22/2022 Predominant rhythm was sinus rhythm No atrial fibrillation noted Less than 1% ventricular and supraventricular ectopy Symptoms of rapid heart rate associated with  sinus rhythm, rate 96 Will Camnitz, MD   ECHOCARDIOGRAM COMPLETE  Result Date: 02/09/2022    ECHOCARDIOGRAM REPORT   Patient Name:   Lori [name] [facility] Date of Exam: 02/09/2022 Medical Rec #:  [id number]           Height:       64.0 in Accession #:    [id number]          Weight:       277.0 lb Date of Birth:  07/30/1990           BSA:          2.247 m Patient Age:    31 years            BP:           122/80 mmHg Patient Gender: F                   HR:           77 bpm. Exam Location:  [address] Procedure: 2D Echo, 3D Echo, Cardiac Doppler, Color Doppler and Strain Analysis Indications:    R00.0 Tachycardia  History:        Patient has no prior history of Echocardiogram examinations.  Sonographer:    Sedonia Small Rodgers-Jones RDCS Referring Phys: [id number] PHILIP J NAHSER IMPRESSIONS  1. Left ventricular ejection fraction, by estimation, is 55 to 60%. Left ventricular ejection fraction by 3D volume is 55 %. The left ventricle has normal function. The left ventricle has no regional wall motion abnormalities. Left ventricular diastolic  parameters were normal. The average left ventricular global longitudinal strain is -21.8 %.  2. Right ventricular systolic function is normal. The right ventricular size is normal. There is normal pulmonary artery systolic pressure. The estimated right ventricular systolic pressure is 21.8 mmHg.  3. The mitral  valve is normal in structure. Trivial mitral valve regurgitation. No evidence of mitral stenosis.  4. The aortic valve is tricuspid. Aortic valve regurgitation is not visualized. No aortic stenosis is present.  5. The inferior vena cava is normal in size with greater than 50% respiratory variability, suggesting right atrial pressure of 3 mmHg. FINDINGS  Left Ventricle: Left ventricular ejection fraction, by estimation, is 55 to 60%. Left ventricular ejection fraction by 3D volume is 55 %. The left ventricle has normal function. The left ventricle has no regional wall motion abnormalities. The average left ventricular global longitudinal strain is -21.8 %. The left ventricular internal cavity size was normal in size. There is no left ventricular hypertrophy. Left ventricular diastolic parameters were normal. Right Ventricle: The right ventricular size is normal. No increase in right ventricular wall thickness. Right ventricular systolic function is normal. There is normal pulmonary artery systolic pressure. The tricuspid regurgitant velocity is 2.17 m/s, and  with an assumed right atrial pressure of 3 mmHg, the estimated right ventricular systolic pressure is 21.8 mmHg. Left Atrium: Left atrial size was normal in size. Right Atrium: Right atrial size was normal in size. Pericardium: There is no evidence of pericardial effusion. Mitral Valve: The mitral valve is normal in structure. Trivial mitral valve regurgitation. No evidence of mitral valve stenosis. Tricuspid Valve: The tricuspid valve is normal in structure. Tricuspid valve regurgitation is trivial. Aortic Valve: The aortic valve is tricuspid. Aortic valve regurgitation is not visualized. No aortic stenosis is present. Pulmonic Valve: The pulmonic valve was not well visualized. Pulmonic valve regurgitation is not visualized. Aorta: The aortic root and ascending aorta are structurally normal,  with no evidence of dilitation. Venous: The inferior vena cava is  normal in size with greater than 50% respiratory variability, suggesting right atrial pressure of 3 mmHg. IAS/Shunts: The interatrial septum was not well visualized.  LEFT VENTRICLE PLAX 2D LVIDd:         4.50 cm         Diastology LVIDs:         3.20 cm         LV e' medial:    13.10 cm/s LV PW:         0.90 cm         LV E/e' medial:  7.2 LV IVS:        0.90 cm         LV e' lateral:   15.00 cm/s LVOT diam:     2.00 cm         LV E/e' lateral: 6.3 LV SV:         61 LV SV Index:   27              2D LVOT Area:     3.14 cm        Longitudinal                                Strain                                2D Strain GLS  -21.8 %                                (A2C):                                2D Strain GLS  -21.1 %                                (A3C):                                2D Strain GLS  -22.4 %                                (A4C):                                2D Strain GLS  -21.8 %                                Avg:                                 3D Volume EF                                LV 3D EF:    Left  ventricul                                             ar                                             ejection                                             fraction                                             by 3D                                             volume is                                             55 %.                                 3D Volume EF:                                3D EF:        55 %                                LV EDV:       170 ml                                LV ESV:       77 ml                                LV SV:        93 ml RIGHT VENTRICLE             IVC RV Basal diam:  3.50 cm     IVC diam: 1.50 cm RV S prime:     11.25 cm/s TAPSE (M-mode): 1.9 cm LEFT ATRIUM             Index        RIGHT ATRIUM           Index LA diam:        4.60 cm 2.05 cm/m   RA Area:     12.50 cm LA Vol (A2C):   47.9 ml 21.32 ml/m   RA Volume:   28.30 ml  12.60 ml/m LA Vol (A4C):  42.5 ml 18.92 ml/m LA Biplane Vol: 47.3 ml 21.05 ml/m  AORTIC VALVE LVOT Vmax:   100.70 cm/s LVOT Vmean:  68.533 cm/s LVOT VTI:    0.195 m  AORTA Ao Root diam: 2.90 cm Ao Asc diam:  3.10 cm MITRAL VALVE               TRICUSPID VALVE MV Area (PHT): 4.39 cm    TR Peak grad:   18.8 mmHg MV Decel Time: 173 msec    TR Vmax:        217.00 cm/s MV E velocity: 94.30 cm/s MV A velocity: 58.20 cm/s  SHUNTS MV E/A ratio:  1.62        Systemic VTI:  0.19 m                            Systemic Diam: 2.00 cm Epifanio Lesches MD Electronically signed by Epifanio Lesches MD Signature Date/Time: 02/09/2022/2:03:45 PM    Final    SLEEP STUDY DOCUMENTS  Result Date: 02/03/2022 Ordered by an unspecified provider.  Polysomnography 4 or more parameters (NPSG)  Result Date: 02/01/2022 Oretha Milch, MD     02/09/2022  3:22 PM Patient Name: Lori [name] Date: 02/01/2022 Gender: Female D.O.B: 07-Mar-1991 Age (years): 31 Referring Provider: Ames Dura NP Height (inches): 64 Interpreting Physician: Cyril Mourning MD, ABSM Weight (lbs): 277 RPSGT: Shelah Lewandowsky BMI: 48 MRN: [id number] Neck Size: 15.50 <br> <br> CLINICAL INFORMATION Sleep Study Type: NPSG Indication for sleep study: Depression, Morning Headaches, Obesity, Snoring, Witnesses Apnea / Gasping During Sleep Epworth Sleepiness Score: 15 SLEEP STUDY TECHNIQUE As per the AASM Manual for the Scoring of Sleep and Associated Events v2.3 (April 2016) with a hypopnea requiring 4% desaturations. The channels recorded and monitored were frontal, central and occipital EEG, electrooculogram (EOG), submentalis EMG (chin), nasal and oral airflow, thoracic and abdominal wall motion, anterior tibialis EMG, snore microphone, electrocardiogram, and pulse oximetry. MEDICATIONS Medications self-administered by patient taken the night of the study : HYDROXYZINE, FLUOXETINE SLEEP ARCHITECTURE The study was initiated at 10:14:11  PM and ended at 5:32:34 AM. Sleep onset time was 1.8 minutes and the sleep efficiency was 96.1%%. The total sleep time was 421.1 minutes. Stage REM latency was 141.0 minutes. The patient spent 3.3%% of the night in stage N1 sleep, 78.0%% in stage N2 sleep, 1.1%% in stage N3 and 17.6% in REM. Alpha intrusion was absent. Supine sleep was 53.53%. RESPIRATORY PARAMETERS The overall apnea/hypopnea index (AHI) was 2.3 per hour. There were 0 total apneas, including 0 obstructive, 0 central and 0 mixed apneas. There were 16 hypopneas and 9 RERAs. The AHI during Stage REM sleep was 11.4 per hour. AHI while supine was 4.3 per hour. The mean oxygen saturation was 96.5%. The minimum SpO2 during sleep was 89.0%. moderate snoring was noted during this study. CARDIAC DATA The 2 lead EKG demonstrated sinus rhythm. The mean heart rate was 102.6 beats per minute. Other EKG findings include: None. LEG MOVEMENT DATA The total PLMS were 0 with a resulting PLMS index of 0.0. Associated arousal with leg movement index was 0.0 . IMPRESSIONS - No significant obstructive sleep apnea occurred during this study (AHI = 2.3/h). Few events noted during supine REM sleep. - The patient had minimal or no oxygen desaturation during the study (Min O2 = 89.0%) - The patient snored with moderate snoring volume. - No cardiac abnormalities were noted during this study. - Clinically significant periodic limb  movements did not occur during sleep. No significant associated arousals. DIAGNOSIS - Hypersomnolence - no cause identified during this study RECOMMENDATIONS - Investigate for other causes of hypersomnolence such as medications or narcolepsy - Avoid alcohol, sedatives and other CNS depressants that may worsen sleep apnea and disrupt normal sleep architecture. - Sleep hygiene should be reviewed to assess factors that may improve sleep quality. - Weight management and regular exercise should be initiated or continued if appropriate. Cyril Mourning MD Board  Certified in Sleep medicine     Assessment & Plan:   Loud snoring - Patient had PSG on 02/01/2022 that showed no significant evidence of obstructive sleep apnea, average apnea-hypopnea index score was 2.3 an hour.  She had a few events during supine REM sleep. No identifiable cause for hypersomnia.  No signs or symptoms of narcolepsy, we will hold off on additional sleep testing at this time.  Recommend patient focus on weight loss efforts and avoid sleeping directly on her back. She can also consider referral to orthodontic dentist for oral appliance due to snoring.  Follow-up if sleep symptoms worsen.   Glenford Bayley, NP 02/28/2022

## 2022-02-28 ENCOUNTER — Ambulatory Visit (INDEPENDENT_AMBULATORY_CARE_PROVIDER_SITE_OTHER): Payer: Medicaid Other | Admitting: Primary Care

## 2022-02-28 ENCOUNTER — Ambulatory Visit (HOSPITAL_COMMUNITY): Payer: BC Managed Care – PPO | Admitting: Psychiatry

## 2022-02-28 ENCOUNTER — Encounter: Payer: Self-pay | Admitting: Primary Care

## 2022-02-28 ENCOUNTER — Encounter: Payer: Self-pay | Admitting: Adult Health

## 2022-02-28 ENCOUNTER — Ambulatory Visit (INDEPENDENT_AMBULATORY_CARE_PROVIDER_SITE_OTHER): Payer: Medicaid Other | Admitting: Adult Health

## 2022-02-28 VITALS — BP 110/70 | HR 100 | Temp 98.7°F | Ht 64.0 in | Wt 288.0 lb

## 2022-02-28 DIAGNOSIS — R0683 Snoring: Secondary | ICD-10-CM | POA: Diagnosis not present

## 2022-02-28 DIAGNOSIS — R000 Tachycardia, unspecified: Secondary | ICD-10-CM | POA: Diagnosis not present

## 2022-02-28 DIAGNOSIS — E668 Other obesity: Secondary | ICD-10-CM | POA: Diagnosis not present

## 2022-02-28 MED ORDER — WEGOVY 0.25 MG/0.5ML ~~LOC~~ SOAJ: 0.2500 mg | SUBCUTANEOUS | 2 refills | Status: AC

## 2022-02-28 NOTE — Progress Notes (Signed)
Reviewed and agree with assessment/plan.   Keyonna Comunale, MD [facility] 02/28/2022, 12:35 PM Pager:  [phone]  

## 2022-02-28 NOTE — Patient Instructions (Addendum)
Polysomnography on 02/01/22 which showed no significant evidence of OSA, average apnea/hypopnea was 2.3/hour You have few apneic events while sleeping on your back during REM sleep No oxygen desaturations or cardiac arrhythmias   We can refer you to orthodontist to be fitted for oral appliance to help with snoring if you would like The two we refer to are Dr. Irene Limbo [phone] Dr. Althea Grimmer [phone]  Recommendations: Avoid sleeping directly on your back Focus on side sleeping position OR use wedge pillow to elevate your head 30 degrees Work on weight loss efforts  Follow-up: Return if sleep symptoms or hypersomnia worsen / call if you need referral to orthodontics for oral appliance d/t snoring

## 2022-02-28 NOTE — Assessment & Plan Note (Addendum)
-   Patient had PSG on 02/01/2022 that showed no significant evidence of obstructive sleep apnea, average apnea-hypopnea index score was 2.3 an hour.  She had a few events during supine REM sleep. No identifiable cause for hypersomnia.  No signs or symptoms of narcolepsy, we will hold off on additional sleep testing at this time.  Recommend patient focus on weight loss efforts and avoid sleeping directly on her back. She can also consider referral to orthodontic dentist for oral appliance due to snoring.  Follow-up if sleep symptoms worsen.

## 2022-03-01 NOTE — Progress Notes (Signed)
Subjective:    Patient ID: Lori [name], female    DOB: 06/19/1991, 31 y.o.   MRN: [id number]  HPI 31 year old female who  has a past medical history of Abnormal uterine bleeding, GERD (gastroesophageal reflux disease), Iron (Fe) deficiency anemia, Migraine, Migraine with aura, Obesity, and PONV (postoperative nausea and vomiting).  Presents to the office today for her weight loss management.  In the past she was on Wegovy and was doing well but developed tachycardia.  Her echocardiogram was within normal limits and her event monitor showed an average heart rate of 99 with a few episodes of tachycardia that was associated with sinus rhythm.  There was not any abnormal arrhythmias.  She was placed on Adderall 25 mg daily by cardiology.  She also had a sleep study done that was negative for sleep apnea.  Cardiology believes that she could resume low-dose of Wegovy to help with weight loss and pulmonary is also on board with weight loss to help prevent sleep apnea in the future.  He is also interested in revisiting bariatric surgery and will reach out to [facility] Review of Systems See HPI   Past Medical History:  Diagnosis Date   Abnormal uterine bleeding    GERD (gastroesophageal reflux disease)    Iron (Fe) deficiency anemia    Migraine    Migraine with aura    Obesity    PONV (postoperative nausea and vomiting)     Social History   Socioeconomic History   Marital status: Single    Spouse name: Not on file   Number of children: Not on file   Years of education: Not on file   Highest education level: Associate degree: occupational, Scientist, product/process development, or vocational program  Occupational History   Not on file  Tobacco Use   Smoking status: Never   Smokeless tobacco: Never  Vaping Use   Vaping Use: Never used  Substance and Sexual Activity   Alcohol use: Not Currently    Comment: social   Drug use: No   Sexual activity: Yes    Partners: Male    Birth  control/protection: Surgical  Other Topics Concern   Not on file  Social History Narrative   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   Two children ( daughter 11 , son 3)       Social Determinants of Health   Financial Resource Strain: Low Risk  (08/22/2021)   Overall Financial Resource Strain (CARDIA)    Difficulty of Paying Living Expenses: Not hard at all  Food Insecurity: No Food Insecurity (08/22/2021)   Hunger Vital Sign    Worried About Running Out of Food in the Last Year: Never true    Ran Out of Food in the Last Year: Never true  Transportation Needs: No Transportation Needs (08/22/2021)   PRAPARE - Administrator, Civil Service (Medical): No    Lack of Transportation (Non-Medical): No  Physical Activity: Insufficiently Active (08/22/2021)   Exercise Vital Sign    Days of Exercise per Week: 3 days    Minutes of Exercise per Session: 30 min  Stress: No Stress Concern Present (08/22/2021)   [employer] of Occupational Health - Occupational Stress Questionnaire    Feeling of Stress : Not at all  Social Connections: Moderately Isolated (08/22/2021)   Social Connection and Isolation Panel [NHANES]    Frequency of Communication with Friends and Family: More than three times a week  Frequency of Social Gatherings with Friends and Family: Once a week    Attends Religious Services: More than 4 times per year    Active Member of [employer] or Organizations: No    Attends Engineer, structural: Not on file    Marital Status: Never married  Intimate Partner Violence: Not on file    Past Surgical History:  Procedure Laterality Date   DILATION AND CURETTAGE, DIAGNOSTIC / THERAPEUTIC  2015   ROBOTIC ASSISTED LAPAROSCOPIC HYSTERECTOMY AND SALPINGECTOMY Bilateral 10/30/2019   Procedure: XI ROBOTIC ASSISTED LAPAROSCOPIC HYSTERECTOMY AND SALPINGECTOMY;  Surgeon: Olivia Mackie, MD;  Location: [facility] ;  Service: Gynecology;  Laterality: Bilateral;   Tracie RNFA confirmed 10/13/19 CS   TUBAL LIGATION  2014   WISDOM TOOTH EXTRACTION      Family History  Problem Relation Age of Onset   Diabetes Mother    Heart disease Mother    Diabetes Father    Kidney disease Father    Asthma Sister    Seizures Sister    Asthma Daughter    Von Willebrand disease Son    Asthma Sister    Diabetes Maternal Grandmother    Heart disease Maternal Grandmother    Diabetes Maternal Grandfather    Diabetes Paternal Grandmother    Lupus Paternal Grandmother    Diabetes Paternal Grandfather     Allergies  Allergen Reactions   Benylin Adult Formula [Dextromethorphan] Shortness Of Breath   Amoxicillin Hives, Itching and Swelling   Benadryl [Diphenhydramine Hcl] Hives   Cefdinir Hives   Tdap [Tetanus-Diphth-Acell Pertussis]    Codeine Itching   Tramadol Itching    Current Outpatient Medications on File Prior to Visit  Medication Sig Dispense Refill   AIMOVIG 140 MG/ML SOAJ INJECT 140 MG INTO THE SKIN EVERY 30 DAYS 1 mL 6   ALPRAZolam (XANAX) 0.25 MG tablet Take 1 tablet (0.25 mg total) by mouth 2 (two) times daily as needed for anxiety. 20 tablet 0   fluconazole (DIFLUCAN) 150 MG tablet Take 1 tablet (150 mg total) by mouth every 3 (three) days as needed. 2 tablet 0   FLUoxetine (PROZAC) 10 MG capsule Take 1 capsule (10 mg total) by mouth daily. 30 capsule 1   hydrOXYzine (VISTARIL) 25 MG capsule Take 1 capsule (25 mg total) by mouth at bedtime as needed for anxiety. 30 capsule 1   metoprolol succinate (TOPROL XL) 25 MG 24 hr tablet Take 1 tablet (25 mg total) by mouth daily. 90 tablet 3   ondansetron (ZOFRAN) 4 MG tablet Take 1 tablet (4 mg total) by mouth every 8 (eight) hours as needed for nausea or vomiting. 20 tablet 2   Rimegepant Sulfate (NURTEC) 75 MG TBDP Take 75 mg by mouth daily as needed. 16 tablet 0   No current facility-administered medications on file prior to visit.    BP 110/70   Pulse 100   Temp 98.7 F (37.1 C) (Oral)   Ht  5\' 4"  (1.626 m)   Wt 288 lb (130.6 kg)   LMP 10/18/2019 Comment: HYSTERECTOMY 10/18/2019  SpO2 98%   BMI 49.44 kg/m       Objective:   Physical Exam Vitals and nursing note reviewed.  Constitutional:      Appearance: Normal appearance. She is obese.  Cardiovascular:     Rate and Rhythm: Regular rhythm. Tachycardia present.     Pulses: Normal pulses.     Heart sounds: Normal heart sounds.  Pulmonary:     Effort: Pulmonary  effort is normal.     Breath sounds: Normal breath sounds.  Skin:    General: Skin is warm and dry.  Neurological:     General: No focal deficit present.     Mental Status: She is alert and oriented to person, place, and time.  Psychiatric:        Mood and Affect: Mood normal.        Behavior: Behavior normal.        Thought Content: Thought content normal.        Judgment: Judgment normal.       Assessment & Plan:  1. Other obesity - we will restart on wegovy 0.25 mg weekly. Advised to continue to monitor pulse. Return precautions reviewed. Follow up in 3 months  - Semaglutide-Weight Management (WEGOVY) 0.25 MG/0.5ML SOAJ; Inject 0.25 mg into the skin once a week.  Dispense: 2 mL; Refill: 2  2. Tachycardia - Continue with metoprolol - Monitor pulse   Shirline Frees, NP

## 2022-03-21 NOTE — Progress Notes (Signed)
Cardiology Office Note:    Date:  03/25/2022   ID:  Cy Blamer, DOB 02-06-1991, MRN [id number]  PCP:  Dorothyann Peng, NP   [facility] Providers Cardiologist:  None     Referring MD: Dorothyann Peng, NP   Chief Complaint: follow-up palpitations  History of Present Illness:    Lori [name] is a very pleasant 31 y.o. female with a hx of tachycardia, palpitations, obesity, and dyspnea on exertion.   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 Referred to cardiology by PCP for evaluation of DOE and tachycardia. Seen by Dr. Acie Fredrickson on 01/04/22 at which time she reported she was on [facility] for the previous 5 months for weight loss. Had lost 20 lbs. Typical resting HR 110 bpm with occasions of HR 140-160s on Apple Watch.  She was advised to start Toprol 25 mg daily and to record EKG on Apple Watch if possible when HR elevated. Advised to return in 3 months for follow-up.   Cardiac monitor ordered by PCP 02/2022 revealed mostly SR with average HR 99 bpm, less than 1% ventricular and supraventricular ectopy, symptoms of rapid heart rate associated with SR, rate 96 bpm, no atrial fibrillation. 2D Echo revealed LVEF 55-60%, no rwma, diastolic parameters indeterminate, normal RV, trivial MR.   Today, she is here for follow-up and is accompanied by her children. Is back on Wegovy per PCP for weight loss. Feels throbbing in her chest at times and also feels heart rate speeding up. Admits symptoms are worse with stress, but continues to have symptoms at rest. Also reports SOB with activity. Is walking for exercise without significant worsening of dyspnea but somewhat limited by leg pain. Symptoms did not significantly improve while off Wegovy. Reports Toprol XL has helped "some." Sleep test revealed no significant evidence of OSA. Weight loss efforts were recommended and she was restarted on [facility] by PCP. Has bilateral LE edema, legs feel "tight." Pain in  bilateral calves. At times legs feel numb. These symptoms interfere with regular exercise. She denies presyncope, syncope, orthopnea, or PND.   Past Medical History:  Diagnosis Date   Abnormal uterine bleeding    GERD (gastroesophageal reflux disease)    Iron (Fe) deficiency anemia    Migraine    Migraine with aura    Obesity    PONV (postoperative nausea and vomiting)     Past Surgical History:  Procedure Laterality Date   DILATION AND CURETTAGE, DIAGNOSTIC / THERAPEUTIC  2015   ROBOTIC ASSISTED LAPAROSCOPIC HYSTERECTOMY AND SALPINGECTOMY Bilateral 10/30/2019   Procedure: XI ROBOTIC ASSISTED LAPAROSCOPIC HYSTERECTOMY AND SALPINGECTOMY;  Surgeon: Brien Few, MD;  Location: [place];  Service: Gynecology;  Laterality: Bilateral;  Tracie RNFA confirmed 10/13/19 CS   TUBAL LIGATION  2014   WISDOM TOOTH EXTRACTION      Current Medications: Current Meds  Medication Sig   AIMOVIG 140 MG/ML SOAJ INJECT 140 MG INTO THE SKIN EVERY 30 DAYS   ALPRAZolam (XANAX) 0.25 MG tablet Take 1 tablet (0.25 mg total) by mouth 2 (two) times daily as needed for anxiety.   fluconazole (DIFLUCAN) 150 MG tablet Take 1 tablet (150 mg total) by mouth every 3 (three) days as needed.   FLUoxetine (PROZAC) 10 MG capsule Take 1 capsule (10 mg total) by mouth daily.   furosemide (LASIX) 20 MG tablet Take 1 tablet (20 mg total) by mouth daily. As needed for Leg edema.   hydrOXYzine (VISTARIL) 25  MG capsule Take 1 capsule (25 mg total) by mouth at bedtime as needed for anxiety.   ondansetron (ZOFRAN) 4 MG tablet Take 1 tablet (4 mg total) by mouth every 8 (eight) hours as needed for nausea or vomiting.   Rimegepant Sulfate (NURTEC) 75 MG TBDP Take 75 mg by mouth daily as needed.   Semaglutide-Weight Management (WEGOVY) 0.25 MG/0.5ML SOAJ Inject 0.25 mg into the skin once a week.   [DISCONTINUED] metoprolol succinate (TOPROL XL) 25 MG 24 hr tablet Take 1 tablet (25 mg total) by mouth daily.      Allergies:   Benylin adult formula [dextromethorphan], Amoxicillin, Benadryl [diphenhydramine hcl], Cefdinir, Tdap [tetanus-diphth-acell pertussis], Codeine, and Tramadol   Social History   Socioeconomic History   Marital status: Single    Spouse name: Not on file   Number of children: Not on file   Years of education: Not on file   Highest education level: Associate degree: occupational, Hotel manager, or vocational program  Occupational History   Not on file  Tobacco Use   Smoking status: Never   Smokeless tobacco: Never  Vaping Use   Vaping Use: Never used  Substance and Sexual Activity   Alcohol use: Not Currently    Comment: social   Drug use: No   Sexual activity: Yes    Partners: Male    Birth control/protection: Surgical  Other Topics Concern   Not on file  Social History Narrative   She is a Psychologist, counselling    Not married   Two children ( daughter 106 , son 3)       Social Determinants of Health   Financial Resource Strain: Low Risk  (08/22/2021)   Overall Financial Resource Strain (CARDIA)    Difficulty of Paying Living Expenses: Not hard at all  Food Insecurity: No Food Insecurity (08/22/2021)   Hunger Vital Sign    Worried About Running Out of Food in the Last Year: Never true    [place] in the Last Year: Never true  Transportation Needs: No Transportation Needs (08/22/2021)   PRAPARE - Hydrologist (Medical): No    Lack of Transportation (Non-Medical): No  Physical Activity: Insufficiently Active (08/22/2021)   Exercise Vital Sign    Days of Exercise per Week: 3 days    Minutes of Exercise per Session: 30 min  Stress: No Stress Concern Present (08/22/2021)   [place]    Feeling of Stress : Not at all  Social Connections: Moderately Isolated (08/22/2021)   Social Connection and Isolation Panel [NHANES]    Frequency of Communication with Friends and Family: More  than three times a week    Frequency of Social Gatherings with Friends and Family: Once a week    Attends Religious Services: More than 4 times per year    Active Member of [employer] or Organizations: No    Attends Music therapist: Not on file    Marital Status: Never married     Family History: The patient's family history includes Asthma in her daughter, sister, and sister; Diabetes in her father, maternal grandfather, maternal grandmother, mother, paternal grandfather, and paternal grandmother; Heart disease in her maternal grandmother and mother; Kidney disease in her father; Lupus in her paternal grandmother; Seizures in her sister; Von Willebrand disease in her son.  ROS:   Please see the history of present illness.    + palpitations + bilateral leg pain +  DOE All other systems reviewed and are negative.  Labs/Other Studies Reviewed:    The following studies were reviewed today:  Echo 02/09/22  1. Left ventricular ejection fraction, by estimation, is 55 to 60%. Left  ventricular ejection fraction by 3D volume is 55 %. The left ventricle has  normal function. The left ventricle has no regional wall motion  abnormalities. Left ventricular diastolic   parameters were normal. The average left ventricular global longitudinal  strain is -21.8 %.   2. Right ventricular systolic function is normal. The right ventricular  size is normal. There is normal pulmonary artery systolic pressure. The  estimated right ventricular systolic pressure is 21.8 mmHg.   3. The mitral valve is normal in structure. Trivial mitral valve  regurgitation. No evidence of mitral stenosis.   4. The aortic valve is tricuspid. Aortic valve regurgitation is not  visualized. No aortic stenosis is present.   5. The inferior vena cava is normal in size with greater than 50%  respiratory variability, suggesting right atrial pressure of 3 mmHg.   Cardiac monitor 02/22/22  Predominant rhythm was sinus  rhythm No atrial fibrillation noted Less than 1% ventricular and supraventricular ectopy Symptoms of rapid heart rate associated with sinus rhythm, rate 96    Recent Labs: 07/28/2021: ALT 16; BUN 9; Creatinine, Ser 0.71; Hemoglobin 13.1; Platelets 333.0; Potassium 3.7; Sodium 141; TSH 1.20  Recent Lipid Panel    Component Value Date/Time   CHOL 180 07/28/2021 1001   TRIG 71.0 07/28/2021 1001   HDL 46.90 07/28/2021 1001   CHOLHDL 4 07/28/2021 1001   VLDL 14.2 07/28/2021 1001   LDLCALC 119 (H) 07/28/2021 1001     Risk Assessment/Calculations:       Physical Exam:    VS:  BP 130/82   Pulse 92   Ht 5\' 8"  (1.727 m)   Wt 286 lb (129.7 kg)   LMP 10/18/2019 Comment: HYSTERECTOMY 10/18/2019  BMI 43.49 kg/m     Wt Readings from Last 3 Encounters:  03/24/22 286 lb (129.7 kg)  02/28/22 288 lb (130.6 kg)  02/28/22 287 lb 6.4 oz (130.4 kg)     GEN:  Well developed, obese female in no acute distress HEENT: Normal NECK: No JVD; No carotid bruits CARDIAC: RRR, no murmurs, rubs, gallops RESPIRATORY:  Clear to auscultation without rales, wheezing or rhonchi  ABDOMEN: Soft, non-tender, non-distended MUSCULOSKELETAL: Non-pitting edema bilateral LE; No deformity. 2+ pedal pulses, equal bilaterally SKIN: Warm and dry NEUROLOGIC:  Alert and oriented x 3 PSYCHIATRIC:  Normal affect   EKG:  EKG is not ordered today.    Diagnoses:    1. Claudication (HCC)   2. Tachycardia   3. Numbness   4. Pain in both lower extremities   5. Swelling   6. Heart palpitations    Assessment and Plan:     Palpitations/Tachycardia: Continues to have palpitations as well as feeling heart rate speed up.  Symptoms somewhat improved on Toprol.  No significant improvement while off Wegovy, she has recently resumed it for weight loss. Reviewed echo results, no structural heart disease. We will increase Toprol XL to 50 mg at bedtime in order to reduce fatigue.   Claudication: Pain in bilateral LE that  improves when she is walking. Additionally has symptoms of numbness with ambulation and pain behind her knees. Has an area of discoloration behind her left knee and swelling that concerns her. No temperature or color change to lower extremities. Reports PCP advised ED follow-up for DVT  3 months ago. No trauma, no recent travel. We discussed the difference between venous and arterial blood flow. We will get ABIs as well as venous duplex for further evaluation of concerning symptoms.   Bilateral LE edema: Non-pitting edema bilateral LE. Improves slightly with leg elevation but legs consistently feel tight. Has difficulty with compression 2/2 tight band around top cuts into her skin. Advised her to try Lasix 20 mg daily as needed for discomfort.      Disposition: 3 months with me  Medication Adjustments/Labs and Tests Ordered: Current medicines are reviewed at length with the patient today.  Concerns regarding medicines are outlined above.  Orders Placed This Encounter  Procedures   VAS [place] ABI WITH/WO TBI   VAS [place] LOWER EXTREMITY VENOUS (DVT)   Meds ordered this encounter  Medications   metoprolol succinate (TOPROL XL) 50 MG 24 hr tablet    Sig: Take 1 tablet (50 mg total) by mouth every evening.    Dispense:  90 tablet    Refill:  3   furosemide (LASIX) 20 MG tablet    Sig: Take 1 tablet (20 mg total) by mouth daily. As needed for Leg edema.    Dispense:  90 tablet    Refill:  3    Patient Instructions  Medication Instructions:   INCREASE Toprol one (1) tablet by mouth ( 50 mg) in the evening.  You can take two (2) of your (25 mg) tablets at the same time and use them up.   START Lasix one (1) tablet by mouth 20 mg daily as needed for leg edema.   *If you need a refill on your cardiac medications before your next appointment, please call your pharmacy*  Lab Work:  None ordered.  If you have labs (blood work) drawn today and your tests are completely normal, you will receive your  results only by: [place] (if you have MyChart) OR A paper copy in the mail If you have any lab test that is abnormal or we need to change your treatment, we will call you to review the results.  Testing/Procedures:  Your physician has requested that you have a lower or upper extremity venous duplex. This test is an ultrasound of the veins in the legs or arms. It looks at venous blood flow that carries blood from the heart to the legs or arms. Allow one hour for a Lower Venous exam. Allow thirty minutes for an Upper Venous exam. There are no restrictions or special instructions. Your physician has requested that you have an ankle brachial index (ABI). During this test an ultrasound and blood pressure cuff are used to evaluate the arteries that supply the arms and legs with blood. Allow thirty minutes for this exam. There are no restrictions or special instructions.  Follow-Up: At [facility], you and your health needs are our priority.  As part of our continuing mission to provide you with exceptional heart care, we have created designated Provider Care Teams.  These Care Teams include your primary Cardiologist (physician) and Advanced Practice Providers (APPs -  Physician Assistants and Nurse Practitioners) who all work together to provide you with the care you need, when you need it.  We recommend signing up for the patient portal called "MyChart".  Sign up information is provided on this After Visit Summary.  MyChart is used to connect with patients for Virtual Visits (Telemedicine).  Patients are able to view lab/test results, encounter notes, upcoming appointments, etc.  Non-urgent messages  can be sent to your provider as well.   To learn more about what you can do with MyChart, go to [email or web address].    Your next appointment:   3 month(s)  The format for your next appointment:   In Person  Provider:   Christen Bame, NP         Important Information About  [place]         Signed, Emmaline Life, NP  03/25/2022 7:36 AM    [place]

## 2022-03-24 ENCOUNTER — Ambulatory Visit: Payer: Medicaid Other | Attending: Nurse Practitioner | Admitting: Nurse Practitioner

## 2022-03-24 ENCOUNTER — Encounter: Payer: Self-pay | Admitting: Nurse Practitioner

## 2022-03-24 VITALS — BP 130/82 | HR 92 | Ht 68.0 in | Wt 286.0 lb

## 2022-03-24 DIAGNOSIS — R200 Anesthesia of skin: Secondary | ICD-10-CM

## 2022-03-24 DIAGNOSIS — M79605 Pain in left leg: Secondary | ICD-10-CM

## 2022-03-24 DIAGNOSIS — R000 Tachycardia, unspecified: Secondary | ICD-10-CM | POA: Diagnosis not present

## 2022-03-24 DIAGNOSIS — I739 Peripheral vascular disease, unspecified: Secondary | ICD-10-CM

## 2022-03-24 DIAGNOSIS — R609 Edema, unspecified: Secondary | ICD-10-CM

## 2022-03-24 DIAGNOSIS — M79604 Pain in right leg: Secondary | ICD-10-CM | POA: Diagnosis not present

## 2022-03-24 DIAGNOSIS — R002 Palpitations: Secondary | ICD-10-CM

## 2022-03-24 MED ORDER — METOPROLOL SUCCINATE ER 50 MG PO TB24: 50.0000 mg | ORAL_TABLET | Freq: Every evening | ORAL | 3 refills | Status: DC

## 2022-03-24 MED ORDER — FUROSEMIDE 20 MG PO TABS: 20.0000 mg | ORAL_TABLET | Freq: Every day | ORAL | 3 refills | Status: DC

## 2022-03-24 NOTE — Patient Instructions (Signed)
Medication Instructions:   INCREASE Toprol one (1) tablet by mouth ( 50 mg) in the evening.  You can take two (2) of your (25 mg) tablets at the same time and use them up.   START Lasix one (1) tablet by mouth 20 mg daily as needed for leg edema.   *If you need a refill on your cardiac medications before your next appointment, please call your pharmacy*  Lab Work:  None ordered.  If you have labs (blood work) drawn today and your tests are completely normal, you will receive your results only by: [place] (if you have MyChart) OR A paper copy in the mail If you have any lab test that is abnormal or we need to change your treatment, we will call you to review the results.  Testing/Procedures:  Your physician has requested that you have a lower or upper extremity venous duplex. This test is an ultrasound of the veins in the legs or arms. It looks at venous blood flow that carries blood from the heart to the legs or arms. Allow one hour for a Lower Venous exam. Allow thirty minutes for an Upper Venous exam. There are no restrictions or special instructions. Your physician has requested that you have an ankle brachial index (ABI). During this test an ultrasound and blood pressure cuff are used to evaluate the arteries that supply the arms and legs with blood. Allow thirty minutes for this exam. There are no restrictions or special instructions.  Follow-Up: At [facility], you and your health needs are our priority.  As part of our continuing mission to provide you with exceptional heart care, we have created designated Provider Care Teams.  These Care Teams include your primary Cardiologist (physician) and Advanced Practice Providers (APPs -  Physician Assistants and Nurse Practitioners) who all work together to provide you with the care you need, when you need it.  We recommend signing up for the patient portal called "MyChart".  Sign up information is provided on this After  Visit Summary.  MyChart is used to connect with patients for Virtual Visits (Telemedicine).  Patients are able to view lab/test results, encounter notes, upcoming appointments, etc.  Non-urgent messages can be sent to your provider as well.   To learn more about what you can do with MyChart, go to [email or web address].    Your next appointment:   3 month(s)  The format for your next appointment:   In Person  Provider:   Christen Bame, NP         Important Information About [place]

## 2022-03-25 ENCOUNTER — Encounter: Payer: Self-pay | Admitting: Nurse Practitioner

## 2022-03-27 ENCOUNTER — Other Ambulatory Visit: Payer: Self-pay | Admitting: Nurse Practitioner

## 2022-03-27 DIAGNOSIS — M79605 Pain in left leg: Secondary | ICD-10-CM

## 2022-03-27 DIAGNOSIS — R609 Edema, unspecified: Secondary | ICD-10-CM

## 2022-03-31 ENCOUNTER — Telehealth: Payer: Medicaid Other | Admitting: Adult Health

## 2022-03-31 ENCOUNTER — Ambulatory Visit (HOSPITAL_COMMUNITY)
Admission: RE | Admit: 2022-03-31 | Discharge: 2022-03-31 | Disposition: A | Discharge status: Home / Self Care | Payer: BC Managed Care – PPO | Source: Ambulatory Visit | Attending: Cardiology | Admitting: Cardiology

## 2022-03-31 DIAGNOSIS — M79604 Pain in right leg: Secondary | ICD-10-CM | POA: Insufficient documentation

## 2022-03-31 DIAGNOSIS — R609 Edema, unspecified: Secondary | ICD-10-CM | POA: Diagnosis present

## 2022-03-31 DIAGNOSIS — M79605 Pain in left leg: Secondary | ICD-10-CM | POA: Insufficient documentation

## 2022-04-06 ENCOUNTER — Encounter: Payer: Self-pay | Admitting: Adult Health

## 2022-04-06 ENCOUNTER — Ambulatory Visit (INDEPENDENT_AMBULATORY_CARE_PROVIDER_SITE_OTHER): Payer: Medicaid Other | Admitting: Adult Health

## 2022-04-06 VITALS — BP 118/72 | HR 89 | Temp 98.3°F | Wt 282.0 lb

## 2022-04-06 DIAGNOSIS — Z23 Encounter for immunization: Secondary | ICD-10-CM | POA: Diagnosis not present

## 2022-04-06 DIAGNOSIS — R030 Elevated blood-pressure reading, without diagnosis of hypertension: Secondary | ICD-10-CM

## 2022-04-06 NOTE — Progress Notes (Signed)
Subjective:    Patient ID: Lori [name], female    DOB: 10/16/1990, 31 y.o.   MRN: [id number]  HPI  31 year old female who is being evaluated today for blood pressure management.  She reports that over the last couple weeks she been checking her blood pressure at home periodically and has noticed alternating readings.  She has had readings as high as 143 systolic and as low as 112 systolic.  She did have a headache at 1 point in time and checked her blood pressure and it was high.    He is currently prescribed Lasix 20 mg daily as needed for lower extremity edema and metoprolol 50 mg daily for tachycardia and palpitations.  Both of these medications are prescribed by cardiology.  Send [place] a note last week notifying us of the alternating blood pressure readings, she was not experiencing much swelling in her lower extremities so I had her take the Lasix every other day as needed and continue with metoprolol 50 mg daily.  She has not checked her blood pressure over the last 6 days.  Today her blood pressure was 118/72 on 2 separate occasions.  BP Readings from Last 3 Encounters:  04/06/22 112/62  03/24/22 130/82  02/28/22 110/70     Review of Systems See HPI   Past Medical History:  Diagnosis Date   Abnormal uterine bleeding    GERD (gastroesophageal reflux disease)    Iron (Fe) deficiency anemia    Migraine    Migraine with aura    Obesity    PONV (postoperative nausea and vomiting)     Social History   Socioeconomic History   Marital status: Single    Spouse name: Not on file   Number of children: Not on file   Years of education: Not on file   Highest education level: Associate degree: occupational, Scientist, product/process development, or vocational program  Occupational History   Not on file  Tobacco Use   Smoking status: Never   Smokeless tobacco: Never  Vaping Use   Vaping Use: Never used  Substance and Sexual Activity   Alcohol use: Not Currently    Comment: social   Drug  use: No   Sexual activity: Yes    Partners: Male    Birth control/protection: Surgical  Other Topics Concern   Not on file  Social History Narrative   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   Two children ( daughter 59 , son 3)       Social Determinants of Health   Financial Resource Strain: Low Risk  (08/22/2021)   Overall Financial Resource Strain (CARDIA)    Difficulty of Paying Living Expenses: Not hard at all  Food Insecurity: No Food Insecurity (08/22/2021)   Hunger Vital Sign    Worried About Running Out of Food in the Last Year: Never true    Ran Out of Food in the Last Year: Never true  Transportation Needs: No Transportation Needs (08/22/2021)   PRAPARE - Administrator, Civil Service (Medical): No    Lack of Transportation (Non-Medical): No  Physical Activity: Insufficiently Active (08/22/2021)   Exercise Vital Sign    Days of Exercise per Week: 3 days    Minutes of Exercise per Session: 30 min  Stress: No Stress Concern Present (08/22/2021)   [employer] of Occupational Health - Occupational Stress Questionnaire    Feeling of Stress : Not at all  Social Connections: Moderately Isolated (08/22/2021)   Social  Connection and Isolation Panel [NHANES]    Frequency of Communication with Friends and Family: More than three times a week    Frequency of Social Gatherings with Friends and Family: Once a week    Attends Religious Services: More than 4 times per year    Active Member of [employer] or Organizations: No    Attends Music therapist: Not on file    Marital Status: Never married  Intimate Partner Violence: Not on file    Past Surgical History:  Procedure Laterality Date   DILATION AND CURETTAGE, DIAGNOSTIC / THERAPEUTIC  2015   ROBOTIC ASSISTED LAPAROSCOPIC HYSTERECTOMY AND SALPINGECTOMY Bilateral 10/30/2019   Procedure: XI ROBOTIC ASSISTED LAPAROSCOPIC HYSTERECTOMY AND SALPINGECTOMY;  Surgeon: Brien Few, MD;  Location: [place];  Service: Gynecology;  Laterality: Bilateral;  Tracie RNFA confirmed 10/13/19 CS   TUBAL LIGATION  2014   WISDOM TOOTH EXTRACTION      Family History  Problem Relation Age of Onset   Diabetes Mother    Heart disease Mother    Diabetes Father    Kidney disease Father    Asthma Sister    Seizures Sister    Asthma Daughter    Von Willebrand disease Son    Asthma Sister    Diabetes Maternal Grandmother    Heart disease Maternal Grandmother    Diabetes Maternal Grandfather    Diabetes Paternal Grandmother    Lupus Paternal Grandmother    Diabetes Paternal Grandfather     Allergies  Allergen Reactions   Benylin Adult Formula [Dextromethorphan] Shortness Of Breath   Amoxicillin Hives, Itching and Swelling   Benadryl [Diphenhydramine Hcl] Hives   Cefdinir Hives   Eggs Or Egg-Derived Products     Egg free flu vaccine   Tdap [Tetanus-Diphth-Acell Pertussis]    Codeine Itching   Tramadol Itching    Current Outpatient Medications on File Prior to Visit  Medication Sig Dispense Refill   AIMOVIG 140 MG/ML SOAJ INJECT 140 MG INTO THE SKIN EVERY 30 DAYS 1 mL 6   ALPRAZolam (XANAX) 0.25 MG tablet Take 1 tablet (0.25 mg total) by mouth 2 (two) times daily as needed for anxiety. 20 tablet 0   fluconazole (DIFLUCAN) 150 MG tablet Take 1 tablet (150 mg total) by mouth every 3 (three) days as needed. 2 tablet 0   FLUoxetine (PROZAC) 10 MG capsule Take 1 capsule (10 mg total) by mouth daily. 30 capsule 1   furosemide (LASIX) 20 MG tablet Take 1 tablet (20 mg total) by mouth daily. As needed for Leg edema. 90 tablet 3   hydrOXYzine (VISTARIL) 25 MG capsule Take 1 capsule (25 mg total) by mouth at bedtime as needed for anxiety. 30 capsule 1   metoprolol succinate (TOPROL XL) 50 MG 24 hr tablet Take 1 tablet (50 mg total) by mouth every evening. 90 tablet 3   ondansetron (ZOFRAN) 4 MG tablet Take 1 tablet (4 mg total) by mouth every 8 (eight) hours as needed for nausea or  vomiting. 20 tablet 2   Rimegepant Sulfate (NURTEC) 75 MG TBDP Take 75 mg by mouth daily as needed. 16 tablet 0   Semaglutide-Weight Management (WEGOVY) 0.25 MG/0.5ML SOAJ Inject 0.25 mg into the skin once a week. 2 mL 2   No current facility-administered medications on file prior to visit.    Temp 98.3 F (36.8 C) (Oral)   Wt 282 lb (127.9 kg)   LMP 10/18/2019 Comment: HYSTERECTOMY 10/18/2019  BMI 42.88 kg/m  Objective:   Physical Exam Vitals and nursing note reviewed.  Constitutional:      Appearance: Normal appearance.  Cardiovascular:     Rate and Rhythm: Normal rate and regular rhythm.     Pulses: Normal pulses.     Heart sounds: Normal heart sounds.  Pulmonary:     Effort: Pulmonary effort is normal.     Breath sounds: Normal breath sounds.  Musculoskeletal:     Right lower leg: No edema.     Left lower leg: Edema (trace pitting edema to shin) present.  Skin:    General: Skin is warm and dry.  Neurological:     General: No focal deficit present.     Mental Status: She is alert and oriented to person, place, and time.  Psychiatric:        Mood and Affect: Mood normal.        Behavior: Behavior normal.        Thought Content: Thought content normal.        Judgment: Judgment normal.           Assessment & Plan:  1. Transient elevated blood pressure -Blood pressure at goal today.  Continue with current regimen of metoprolol ER 50 mg daily and Lasix every other day as needed.  Advised to continue to monitor blood pressure at home and notify me if blood pressure is elevated  2. Encounter for immunization  - Flu Vaccine MDCK QUAD PF  Shirline Frees, NP

## 2022-04-07 ENCOUNTER — Other Ambulatory Visit: Payer: Self-pay | Admitting: Nurse Practitioner

## 2022-04-07 DIAGNOSIS — R200 Anesthesia of skin: Secondary | ICD-10-CM

## 2022-04-07 DIAGNOSIS — I739 Peripheral vascular disease, unspecified: Secondary | ICD-10-CM

## 2022-04-07 DIAGNOSIS — M79604 Pain in right leg: Secondary | ICD-10-CM

## 2022-04-07 DIAGNOSIS — R000 Tachycardia, unspecified: Secondary | ICD-10-CM

## 2022-04-07 DIAGNOSIS — R609 Edema, unspecified: Secondary | ICD-10-CM

## 2022-04-11 ENCOUNTER — Ambulatory Visit (HOSPITAL_COMMUNITY)
Admission: RE | Admit: 2022-04-11 | Discharge: 2022-04-11 | Disposition: A | Discharge status: Home / Self Care | Payer: BC Managed Care – PPO | Source: Ambulatory Visit | Attending: Cardiology | Admitting: Cardiology

## 2022-04-11 ENCOUNTER — Telehealth: Payer: Self-pay | Admitting: Adult Health

## 2022-04-11 ENCOUNTER — Encounter (HOSPITAL_COMMUNITY): Payer: Medicaid Other

## 2022-04-11 DIAGNOSIS — I739 Peripheral vascular disease, unspecified: Secondary | ICD-10-CM | POA: Insufficient documentation

## 2022-04-11 DIAGNOSIS — M79604 Pain in right leg: Secondary | ICD-10-CM | POA: Insufficient documentation

## 2022-04-11 DIAGNOSIS — R609 Edema, unspecified: Secondary | ICD-10-CM | POA: Diagnosis present

## 2022-04-11 DIAGNOSIS — M79605 Pain in left leg: Secondary | ICD-10-CM | POA: Diagnosis present

## 2022-04-11 DIAGNOSIS — R200 Anesthesia of skin: Secondary | ICD-10-CM | POA: Diagnosis present

## 2022-04-11 DIAGNOSIS — R000 Tachycardia, unspecified: Secondary | ICD-10-CM | POA: Diagnosis present

## 2022-04-11 NOTE — Telephone Encounter (Signed)
error 

## 2022-05-30 ENCOUNTER — Telehealth (INDEPENDENT_AMBULATORY_CARE_PROVIDER_SITE_OTHER): Payer: Medicaid Other | Admitting: Adult Health

## 2022-05-30 ENCOUNTER — Encounter: Payer: Self-pay | Admitting: Adult Health

## 2022-05-30 VITALS — Ht 68.0 in | Wt 282.0 lb

## 2022-05-30 DIAGNOSIS — E669 Obesity, unspecified: Secondary | ICD-10-CM | POA: Diagnosis not present

## 2022-05-30 MED ORDER — WEGOVY 0.25 MG/0.5ML ~~LOC~~ SOAJ: 0.2500 mg | SUBCUTANEOUS | 2 refills | Status: DC

## 2022-05-30 NOTE — Progress Notes (Signed)
Virtual Visit via Video Note  I connected with Prince Rome on 05/30/22 at  7:30 AM EST by a video enabled telemedicine application and verified that I am speaking with the correct person using two identifiers.  Location patient: home Location provider:work or home office Persons participating in the virtual visit: patient, provider  I discussed the limitations of evaluation and management by telemedicine and the availability of in person appointments. The patient expressed understanding and agreed to proceed.   HPI:  She is being evaluated today for weight loss management.  She was on Wegovy and was doing well but developed tachycardia.  Her echocardiogram was within normal limits but her event monitor showed an average heart rate of 99 with a few episodes of tachycardia that was associated with sinus rhythm.   She did have a sleep study done which was negative for sleep apnea.  Cardiology and pulmonary are both on board with weight loss to help prevent sleep apnea in the future and advised starting on a low dose of Wegovy.  Her insurance changed and she wold like to see if it is now covered    Wt Readings from Last 3 Encounters:  05/30/22 282 lb (127.9 kg)  04/06/22 282 lb (127.9 kg)  03/24/22 286 lb (129.7 kg)   ROS: See pertinent positives and negatives per HPI.  Past Medical History:  Diagnosis Date   Abnormal uterine bleeding    GERD (gastroesophageal reflux disease)    Iron (Fe) deficiency anemia    Migraine    Migraine with aura    Obesity    PONV (postoperative nausea and vomiting)     Past Surgical History:  Procedure Laterality Date   DILATION AND CURETTAGE, DIAGNOSTIC / THERAPEUTIC  2015   ROBOTIC ASSISTED LAPAROSCOPIC HYSTERECTOMY AND SALPINGECTOMY Bilateral 10/30/2019   Procedure: XI ROBOTIC ASSISTED LAPAROSCOPIC HYSTERECTOMY AND SALPINGECTOMY;  Surgeon: Olivia Mackie, MD;  Location: [facility];  Service: Gynecology;  Laterality: Bilateral;   Tracie RNFA confirmed 10/13/19 CS   TUBAL LIGATION  2014   WISDOM TOOTH EXTRACTION      Family History  Problem Relation Age of Onset   Diabetes Mother    Heart disease Mother    Diabetes Father    Kidney disease Father    Asthma Sister    Seizures Sister    Asthma Daughter    Von Willebrand disease Son    Asthma Sister    Diabetes Maternal Grandmother    Heart disease Maternal Grandmother    Diabetes Maternal Grandfather    Diabetes Paternal Grandmother    Lupus Paternal Grandmother    Diabetes Paternal Grandfather        Current Outpatient Medications:    AIMOVIG 140 MG/ML SOAJ, INJECT 140 MG INTO THE SKIN EVERY 30 DAYS, Disp: 1 mL, Rfl: 6   ALPRAZolam (XANAX) 0.25 MG tablet, Take 1 tablet (0.25 mg total) by mouth 2 (two) times daily as needed for anxiety., Disp: 20 tablet, Rfl: 0   fluconazole (DIFLUCAN) 150 MG tablet, Take 1 tablet (150 mg total) by mouth every 3 (three) days as needed., Disp: 2 tablet, Rfl: 0   FLUoxetine (PROZAC) 10 MG capsule, Take 1 capsule (10 mg total) by mouth daily., Disp: 30 capsule, Rfl: 1   furosemide (LASIX) 20 MG tablet, Take 1 tablet (20 mg total) by mouth daily. As needed for Leg edema., Disp: 90 tablet, Rfl: 3   hydrOXYzine (VISTARIL) 25 MG capsule, Take 1 capsule (25 mg total) by mouth at  bedtime as needed for anxiety., Disp: 30 capsule, Rfl: 1   metoprolol succinate (TOPROL XL) 50 MG 24 hr tablet, Take 1 tablet (50 mg total) by mouth every evening., Disp: 90 tablet, Rfl: 3   ondansetron (ZOFRAN) 4 MG tablet, Take 1 tablet (4 mg total) by mouth every 8 (eight) hours as needed for nausea or vomiting., Disp: 20 tablet, Rfl: 2   Rimegepant Sulfate (NURTEC) 75 MG TBDP, Take 75 mg by mouth daily as needed., Disp: 16 tablet, Rfl: 0  EXAM:  VITALS per patient if applicable:  GENERAL: alert, oriented, appears well and in no acute distress  HEENT: atraumatic, conjunttiva clear, no obvious abnormalities on inspection of external nose and  ears  NECK: normal movements of the head and neck  LUNGS: on inspection no signs of respiratory distress, breathing rate appears normal, no obvious gross SOB, gasping or wheezing  CV: no obvious cyanosis  MS: moves all visible extremities without noticeable abnormality  PSYCH/NEURO: pleasant and cooperative, no obvious depression or anxiety, speech and thought processing grossly intact  ASSESSMENT AND PLAN:  Discussed the following assessment and plan:  1. Class 2 obesity  - Semaglutide-Weight Management (WEGOVY) 0.25 MG/0.5ML SOAJ; Inject 0.25 mg into the skin once a week.  Dispense: 2 mL; Refill: 2        I discussed the assessment and treatment plan with the patient. The patient was provided an opportunity to ask questions and all were answered. The patient agreed with the plan and demonstrated an understanding of the instructions.   The patient was advised to call back or seek an in-person evaluation if the symptoms worsen or if the condition fails to improve as anticipated.   Dorothyann Peng, NP

## 2022-06-29 NOTE — Progress Notes (Deleted)
Cardiology Office Note:    Date:  06/29/2022   ID:  Lori [name], DOB 15-Sep-1990, MRN [id number]  PCP:  Dorothyann Peng, NP   [facility] Providers Cardiologist:  None     Referring MD: Dorothyann Peng, NP   Chief Complaint: follow-up palpitations  History of Present Illness:    Lori [name] is a very pleasant 32 y.o. female with a hx of tachycardia, palpitations, obesity, and dyspnea on exertion.   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 Referred to cardiology by PCP for evaluation of DOE and tachycardia. Seen by Dr. Acie Fredrickson on 01/04/22 at which time she reported she was on [facility] for the previous 5 months for weight loss. Had lost 20 lbs. Typical resting HR 110 bpm with occasions of HR 140-160s on Apple Watch.  She was advised to start Toprol 25 mg daily and to record EKG on Apple Watch if possible when HR elevated. Advised to return in 3 months for follow-up.   Cardiac monitor ordered by PCP 02/2022 revealed mostly SR with average HR 99 bpm, less than 1% ventricular and supraventricular ectopy, symptoms of rapid heart rate associated with SR, rate 96 bpm, no atrial fibrillation. 2D Echo revealed LVEF 55-60%, no rwma, diastolic parameters indeterminate, normal RV, trivial MR.   Seen by me on 03/24/22 for follow-up. Is back on Wegovy per PCP for weight loss. Feels throbbing in her chest at times and also feels heart rate speeding up. Admits symptoms are worse with stress, but continues to have symptoms at rest. Also reports SOB with activity. Is walking for exercise without significant worsening of dyspnea but somewhat limited by leg pain. Symptoms did not significantly improve while off Wegovy. Reports Toprol XL has helped "some." Sleep test revealed no significant evidence of OSA. Weight loss efforts were recommended and she was restarted on [facility] by PCP. Has bilateral LE edema, legs feel "tight." Pain in bilateral calves. At times legs  feel numb. These symptoms interfere with regular exercise. She denies presyncope, syncope, orthopnea, or PND. We did lower extremity arterial which revealed no obstruction in blood flow.  Today, she is here   Past Medical History:  Diagnosis Date   Abnormal uterine bleeding    GERD (gastroesophageal reflux disease)    Iron (Fe) deficiency anemia    Migraine    Migraine with aura    Obesity    PONV (postoperative nausea and vomiting)     Past Surgical History:  Procedure Laterality Date   DILATION AND CURETTAGE, DIAGNOSTIC / THERAPEUTIC  2015   ROBOTIC ASSISTED LAPAROSCOPIC HYSTERECTOMY AND SALPINGECTOMY Bilateral 10/30/2019   Procedure: XI ROBOTIC ASSISTED LAPAROSCOPIC HYSTERECTOMY AND SALPINGECTOMY;  Surgeon: Brien Few, MD;  Location: [place];  Service: Gynecology;  Laterality: Bilateral;  Tracie RNFA confirmed 10/13/19 CS   TUBAL LIGATION  2014   WISDOM TOOTH EXTRACTION      Current Medications: No outpatient medications have been marked as taking for the 07/03/22 encounter (Appointment) with Ann Maki, Lanice Schwab, NP.     Allergies:   Benylin adult formula [dextromethorphan], Amoxicillin, Benadryl [diphenhydramine hcl], Cefdinir, Eggs or egg-derived products, Tdap [tetanus-diphth-acell pertussis], Codeine, and Tramadol   Social History   Socioeconomic History   Marital status: Single    Spouse name: Not on file   Number of children: Not on file   Years of education: Not on file   Highest education level: Associate degree: occupational, Hotel manager, or vocational program  Occupational History   Not on file  Tobacco Use   Smoking status: Never   Smokeless tobacco: Never  Vaping Use   Vaping Use: Never used  Substance and Sexual Activity   Alcohol use: Not Currently    Comment: social   Drug use: No   Sexual activity: Yes    Partners: Male    Birth control/protection: Surgical  Other Topics Concern   Not on file  Social History Narrative   She  is a Psychologist, counselling    Not married   Two children ( daughter 38 , son 3)       Social Determinants of Health   Financial Resource Strain: Low Risk  (08/22/2021)   Overall Financial Resource Strain (CARDIA)    Difficulty of Paying Living Expenses: Not hard at all  Food Insecurity: No Food Insecurity (08/22/2021)   Hunger Vital Sign    Worried About Running Out of Food in the Last Year: Never true    [place] in the Last Year: Never true  Transportation Needs: No Transportation Needs (08/22/2021)   PRAPARE - Hydrologist (Medical): No    Lack of Transportation (Non-Medical): No  Physical Activity: Insufficiently Active (08/22/2021)   Exercise Vital Sign    Days of Exercise per Week: 3 days    Minutes of Exercise per Session: 30 min  Stress: No Stress Concern Present (08/22/2021)   [place]    Feeling of Stress : Not at all  Social Connections: Moderately Isolated (08/22/2021)   Social Connection and Isolation Panel [NHANES]    Frequency of Communication with Friends and Family: More than three times a week    Frequency of Social Gatherings with Friends and Family: Once a week    Attends Religious Services: More than 4 times per year    Active Member of [employer] or Organizations: No    Attends Music therapist: Not on file    Marital Status: Never married     Family History: The patient's family history includes Asthma in her daughter, sister, and sister; Diabetes in her father, maternal grandfather, maternal grandmother, mother, paternal grandfather, and paternal grandmother; Heart disease in her maternal grandmother and mother; Kidney disease in her father; Lupus in her paternal grandmother; Seizures in her sister; Von Willebrand disease in her son.  ROS:   Please see the history of present illness.    + palpitations + bilateral leg pain + DOE All other systems reviewed and  are negative.  Labs/Other Studies Reviewed:    The following studies were reviewed today:  Echo 02/09/22  1. Left ventricular ejection fraction, by estimation, is 55 to 60%. Left  ventricular ejection fraction by 3D volume is 55 %. The left ventricle has  normal function. The left ventricle has no regional wall motion  abnormalities. Left ventricular diastolic   parameters were normal. The average left ventricular global longitudinal  strain is -21.8 %.   2. Right ventricular systolic function is normal. The right ventricular  size is normal. There is normal pulmonary artery systolic pressure. The  estimated right ventricular systolic pressure is 123XX123 mmHg.   3. The mitral valve is normal in structure. Trivial mitral valve  regurgitation. No evidence of mitral stenosis.   4. The aortic valve is tricuspid. Aortic valve regurgitation is not  visualized. No aortic stenosis is present.   5. The inferior vena cava is normal in size  with greater than 50%  respiratory variability, suggesting right atrial pressure of 3 mmHg.   Cardiac monitor 02/22/22  Predominant rhythm was sinus rhythm No atrial fibrillation noted Less than 1% ventricular and supraventricular ectopy Symptoms of rapid heart rate associated with sinus rhythm, rate 96    Recent Labs: 07/28/2021: ALT 16; BUN 9; Creatinine, Ser 0.71; Hemoglobin 13.1; Platelets 333.0; Potassium 3.7; Sodium 141; TSH 1.20  Recent Lipid Panel    Component Value Date/Time   CHOL 180 07/28/2021 1001   TRIG 71.0 07/28/2021 1001   HDL 46.90 07/28/2021 1001   CHOLHDL 4 07/28/2021 1001   VLDL 14.2 07/28/2021 1001   LDLCALC 119 (H) 07/28/2021 1001     Risk Assessment/Calculations:       Physical Exam:    VS:  LMP 10/18/2019 Comment: HYSTERECTOMY 10/18/2019    Wt Readings from Last 3 Encounters:  05/30/22 282 lb (127.9 kg)  04/06/22 282 lb (127.9 kg)  03/24/22 286 lb (129.7 kg)     GEN:  Well developed, obese female in no acute  distress HEENT: Normal NECK: No JVD; No carotid bruits CARDIAC: RRR, no murmurs, rubs, gallops RESPIRATORY:  Clear to auscultation without rales, wheezing or rhonchi  ABDOMEN: Soft, non-tender, non-distended MUSCULOSKELETAL: Non-pitting edema bilateral LE; No deformity. 2+ pedal pulses, equal bilaterally SKIN: Warm and dry NEUROLOGIC:  Alert and oriented x 3 PSYCHIATRIC:  Normal affect   EKG:  EKG is ***  Diagnoses:    No diagnosis found.  Assessment and Plan:     Palpitations/Tachycardia: Continues to have palpitations as well as feeling heart rate speed up.  Symptoms somewhat improved on Toprol.  No significant improvement while off Wegovy, she has recently resumed it for weight loss. Reviewed echo results, no structural heart disease. We will increase Toprol XL to 50 mg at bedtime in order to reduce fatigue.   Claudication: Pain in bilateral LE that improves when she is walking. Additionally has symptoms of numbness with ambulation and pain behind her knees. Has an area of discoloration behind her left knee and swelling that concerns her. No temperature or color change to lower extremities. Reports PCP advised ED follow-up for DVT 3 months ago. No trauma, no recent travel. We discussed the difference between venous and arterial blood flow. We will get ABIs as well as venous duplex for further evaluation of concerning symptoms.   Bilateral LE edema: Non-pitting edema bilateral LE. Improves slightly with leg elevation but legs consistently feel tight. Has difficulty with compression 2/2 tight band around top cuts into her skin. Advised her to try Lasix 20 mg daily as needed for discomfort.      Disposition: ***  Medication Adjustments/Labs and Tests Ordered: Current medicines are reviewed at length with the patient today.  Concerns regarding medicines are outlined above.  No orders of the defined types were placed in this encounter.  No orders of the defined types were placed in  this encounter.   There are no Patient Instructions on file for this visit.   Signed, Emmaline Life, NP  06/29/2022 6:09 AM    [place]

## 2022-07-03 ENCOUNTER — Ambulatory Visit: Payer: Medicaid Other | Admitting: Nurse Practitioner

## 2022-07-05 ENCOUNTER — Telehealth: Payer: Self-pay | Admitting: Adult Health

## 2022-07-05 NOTE — Telephone Encounter (Signed)
Noted  

## 2022-07-05 NOTE — Telephone Encounter (Signed)
Physical Examination Form to be filled out- placed in dr's folder.  Pt needs to pick up form by Friday, 07/07/22.  Please call [phone] upon completion.

## 2022-07-06 NOTE — Telephone Encounter (Signed)
Noted! Pt notified of update.  

## 2022-07-06 NOTE — Telephone Encounter (Signed)
Patient notified of update  and verbalized understanding. 

## 2022-07-06 NOTE — Telephone Encounter (Signed)
Form placed in front office filing cabinet

## 2022-07-12 NOTE — Progress Notes (Unsigned)
Cardiology Office Note:    Date:  07/22/2022   ID:  Lori [name], DOB [birth year], MRN [id number]  PCP:  Shirline Frees, NP   [facility] Providers Cardiologist:  None     Referring MD: Shirline Frees, NP   Chief Complaint: follow-up palpitations  History of Present Illness:    Lori [name] is a very pleasant 32 y.o. female with a hx of tachycardia, palpitations, obesity, dyspnea on exertion, and family hx of CAD (mother had valve surgery, maternal GF had triple bypass).   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 Referred to cardiology by PCP for evaluation of DOE and tachycardia. Seen by Dr. Elease Hashimoto on 01/04/22 at which time she reported she was on [facility] for the previous 5 months for weight loss. Had lost 20 lbs. Typical resting HR 110 bpm with occasions of HR 140-160s on Apple Watch.  She was advised to start Toprol 25 mg daily and to record EKG on Apple Watch if possible when HR elevated. Advised to return in 3 months for follow-up.   Cardiac monitor ordered by PCP 02/2022 revealed mostly SR with average HR 99 bpm, less than 1% ventricular and supraventricular ectopy, symptoms of rapid heart rate associated with SR, rate 96 bpm, no atrial fibrillation. 2D Echo revealed LVEF 55-60%, no rwma, diastolic parameters indeterminate, normal RV, trivial MR.   Seen by me on 03/24/22 for follow-up. Is back on Wegovy per PCP for weight loss. Feels throbbing in her chest at times and also feels heart rate speeding up. Admits symptoms are worse with stress, but continues to have symptoms at rest. Also reported SOB with activity. Is walking for exercise without significant worsening of dyspnea but somewhat limited by leg pain. Symptoms did not significantly improve while off Wegovy. Reported Toprol XL has helped "some." Sleep test revealed no significant evidence of OSA. Weight loss efforts were recommended and she was restarted on [facility] by PCP.  Bilateral LE edema, legs feel "tight." Pain in bilateral calves. At times legs feel numb. These symptoms interfere with regular exercise. Denied presyncope, syncope, orthopnea, or PND. We did lower extremity arterial which revealed no obstruction in blood flow.  Today, she returns alone for evaluation. Was seen by PCP on 07/19/22  with cold symptoms. Flu, Covid and rapid strep were negative. Feeling better today but is frequently exhausted. She is working, attending nursing school at night, and has 2 young children. Admits to feeling stressed with all of her responsibilities. Thinks she feels more tired on metoprolol and has not noted any improvement in palpitations. Continues to have feelings of her heart rate speeding up and chest discomfort. Has shortness of breath at rest, not with activity. Does not drink excess caffeine. No alcohol. Has occasional soda, no coffee, no energy drinks. Feels tired since starting metoprolol, no improvement in palpitations. Her legs continue to ache with exercise.   Past Medical History:  Diagnosis Date   Abnormal uterine bleeding    GERD (gastroesophageal reflux disease)    Iron (Fe) deficiency anemia    Migraine    Migraine with aura    Obesity    PONV (postoperative nausea and vomiting)     Past Surgical History:  Procedure Laterality Date   DILATION AND CURETTAGE, DIAGNOSTIC / THERAPEUTIC  2015   ROBOTIC ASSISTED LAPAROSCOPIC HYSTERECTOMY AND SALPINGECTOMY Bilateral 10/30/2019   Procedure: XI ROBOTIC ASSISTED LAPAROSCOPIC HYSTERECTOMY AND SALPINGECTOMY;  Surgeon: Olivia Mackie, MD;  Location: [facility];  Service: Gynecology;  Laterality: Bilateral;  Tracie RNFA confirmed 10/13/19 CS   TUBAL LIGATION  2014   WISDOM TOOTH EXTRACTION      Current Medications: Current Meds  Medication Sig   AIMOVIG 140 MG/ML SOAJ INJECT 140 MG INTO THE SKIN EVERY 30 DAYS   ALPRAZolam (XANAX) 0.25 MG tablet Take 1 tablet (0.25 mg total) by mouth 2 (two)  times daily as needed for anxiety.   benzonatate (TESSALON) 200 MG capsule Take 1 capsule (200 mg total) by mouth 2 (two) times daily as needed for cough.   fluconazole (DIFLUCAN) 150 MG tablet Take 1 tablet (150 mg total) by mouth every 3 (three) days as needed.   FLUoxetine (PROZAC) 10 MG capsule Take 1 capsule (10 mg total) by mouth daily.   furosemide (LASIX) 20 MG tablet Take 1 tablet (20 mg total) by mouth daily. As needed for Leg edema.   hydrOXYzine (VISTARIL) 25 MG capsule Take 1 capsule (25 mg total) by mouth at bedtime as needed for anxiety.   ondansetron (ZOFRAN) 4 MG tablet Take 1 tablet (4 mg total) by mouth every 8 (eight) hours as needed for nausea or vomiting.   Rimegepant Sulfate (NURTEC) 75 MG TBDP Take 75 mg by mouth daily as needed.   [DISCONTINUED] metoprolol succinate (TOPROL XL) 50 MG 24 hr tablet Take 1 tablet (50 mg total) by mouth every evening.     Allergies:   Benylin adult formula [dextromethorphan], Amoxicillin, Benadryl [diphenhydramine hcl], Cefdinir, Eggs or egg-derived products, Tdap [tetanus-diphth-acell pertussis], Codeine, and Tramadol   Social History   Socioeconomic History   Marital status: Single    Spouse name: Not on file   Number of children: Not on file   Years of education: Not on file   Highest education level: Associate degree: occupational, Hotel manager, or vocational program  Occupational History   Not on file  Tobacco Use   Smoking status: Never   Smokeless tobacco: Never  Vaping Use   Vaping Use: Never used  Substance and Sexual Activity   Alcohol use: Not Currently    Comment: social   Drug use: No   Sexual activity: Yes    Partners: Male    Birth control/protection: Surgical  Other Topics Concern   Not on file  Social History Narrative   She is a Psychologist, counselling    Not married   Two children ( daughter 73 , son 3)       Social Determinants of Health   Financial Resource Strain: Low Risk  (08/22/2021)   Overall Financial  Resource Strain (CARDIA)    Difficulty of Paying Living Expenses: Not hard at all  Food Insecurity: No Food Insecurity (08/22/2021)   Hunger Vital Sign    Worried About Running Out of Food in the Last Year: Never true    [place] in the Last Year: Never true  Transportation Needs: No Transportation Needs (08/22/2021)   PRAPARE - Hydrologist (Medical): No    Lack of Transportation (Non-Medical): No  Physical Activity: Insufficiently Active (08/22/2021)   Exercise Vital Sign    Days of Exercise per Week: 3 days    Minutes of Exercise per Session: 30 min  Stress: No Stress Concern Present (08/22/2021)   [place]    Feeling of Stress : Not at all  Social Connections: Moderately Isolated (08/22/2021)   Social Connection and Isolation Panel [NHANES]  Frequency of Communication with Friends and Family: More than three times a week    Frequency of Social Gatherings with Friends and Family: Once a week    Attends Religious Services: More than 4 times per year    Active Member of [employer] or Organizations: No    Attends Music therapist: Not on file    Marital Status: Never married     Family History: The patient's family history includes Asthma in her daughter, sister, and sister; Diabetes in her father, maternal grandfather, maternal grandmother, mother, paternal grandfather, and paternal grandmother; Heart disease in her maternal grandmother and mother; Kidney disease in her father; Lupus in her paternal grandmother; Seizures in her sister; Von Willebrand disease in her son.  ROS:   Please see the history of present illness.    + palpitations + bilateral leg pain + DOE All other systems reviewed and are negative.  Labs/Other Studies Reviewed:    The following studies were reviewed today:  Lower Extremity Vascular US 04/12/22 Summary:  Right: Resting right ankle-brachial index  is within normal range. The  right toe-brachial index is normal.   Left: Resting left ankle-brachial index is within normal range. The left  toe-brachial index is normal.   *See table(s) above for measurements and observations.    VAS LE US DVT 04/01/22 Summary:  RIGHT:  - No evidence of deep vein thrombosis in the lower extremity. No indirect  evidence of obstruction proximal to the inguinal ligament.  - No cystic structure found in the popliteal fossa.    LEFT:  - No evidence of deep vein thrombosis in the lower extremity. No indirect  evidence of obstruction proximal to the inguinal ligament.  - No cystic structure found in the popliteal fossa.     *See table(s) above for measurements and observations.   Echo 02/09/22  1. Left ventricular ejection fraction, by estimation, is 55 to 60%. Left  ventricular ejection fraction by 3D volume is 55 %. The left ventricle has  normal function. The left ventricle has no regional wall motion  abnormalities. Left ventricular diastolic   parameters were normal. The average left ventricular global longitudinal  strain is -21.8 %.   2. Right ventricular systolic function is normal. The right ventricular  size is normal. There is normal pulmonary artery systolic pressure. The  estimated right ventricular systolic pressure is 75.1 mmHg.   3. The mitral valve is normal in structure. Trivial mitral valve  regurgitation. No evidence of mitral stenosis.   4. The aortic valve is tricuspid. Aortic valve regurgitation is not  visualized. No aortic stenosis is present.   5. The inferior vena cava is normal in size with greater than 50%  respiratory variability, suggesting right atrial pressure of 3 mmHg.   Cardiac monitor 02/22/22  Predominant rhythm was sinus rhythm No atrial fibrillation noted Less than 1% ventricular and supraventricular ectopy Symptoms of rapid heart rate associated with sinus rhythm, rate 96    Recent Labs: 07/28/2021: ALT  16; BUN 9; Creatinine, Ser 0.71; Hemoglobin 13.1; Platelets 333.0; Potassium 3.7; Sodium 141 07/21/2022: TSH 0.939  Recent Lipid Panel    Component Value Date/Time   CHOL 180 07/28/2021 1001   TRIG 71.0 07/28/2021 1001   HDL 46.90 07/28/2021 1001   CHOLHDL 4 07/28/2021 1001   VLDL 14.2 07/28/2021 1001   LDLCALC 119 (H) 07/28/2021 1001     Risk Assessment/Calculations:       Physical Exam:    VS:  BP 122/84  Pulse 99   Ht 5\' 4"  (1.626 m)   Wt 285 lb 6.4 oz (129.5 kg)   LMP 10/18/2019 Comment: HYSTERECTOMY 10/18/2019  SpO2 97%   BMI 48.99 kg/m     Wt Readings from Last 3 Encounters:  07/21/22 285 lb 6.4 oz (129.5 kg)  07/19/22 282 lb (127.9 kg)  05/30/22 282 lb (127.9 kg)     GEN:  Well developed, obese female in no acute distress HEENT: Normal NECK: No JVD; No carotid bruits CARDIAC: RRR, no murmurs, rubs, gallops RESPIRATORY:  Clear to auscultation without rales, wheezing or rhonchi  ABDOMEN: Soft, non-tender, non-distended MUSCULOSKELETAL: Non-pitting edema bilateral LE; No deformity. 2+ pedal pulses, equal bilaterally SKIN: Warm and dry NEUROLOGIC:  Alert and oriented x 3 PSYCHIATRIC:  Normal affect   EKG:  EKG is not ordered today  Diagnoses:    1. Tachycardia   2. Claudication (HCC)   3. Heart palpitations   4. Shortness of breath   5. Bilateral leg edema     Assessment and Plan:     Palpitations/Tachycardia: Continues to have palpitations as well as feeling heart rate speed up.  No significant improvement on Toprol and is feeling more fatigued. No structural heart disease on echo 01/2022 and only occasional ectopy on cardiac monitor 02/2022. Lengthy discussion about likelihood of stress induced symptoms and her current challenges with school, work and young children. Encouraged her to work on ways she can incorporate rest and the importance of healthy diet. Will have her wean off metoprolol and see if fatigue improves. If palpitations worsen, could try low  dose propranolol as needed.   Shortness of breath:  Shortness of breath occurs at rest and is not worsened with exertion.  No prior history of asthma and no history of frequent respiratory infection. Normal LV function, no significant valve disease on echo 01/2022.  We discussed getting CXR, however would like to avoid radiation when possible. She will consider this for the future.   Claudication: Continues to have pain in legs with activity. Vascular studies of the lower extremities were normal 03/2022. Continue to work on weight loss and increasing physical activity. Encouraged her to use steps, park far from entry, increase overall steps.   Bilateral LE edema: Non-pitting edema bilateral LE. Cannot tolerate compression. Continue Lasix 20 mg daily as needed for discomfort.      Disposition: 6 months with me  Medication Adjustments/Labs and Tests Ordered: Current medicines are reviewed at length with the patient today.  Concerns regarding medicines are outlined above.  Orders Placed This Encounter  Procedures   TSH   EKG 12-Lead   No orders of the defined types were placed in this encounter.   Patient Instructions  Medication Instructions:   DISCONTINUE Toprol start with (25 mg) for a few days, than decrease (12.5 mg ) for a few days than STOP.  *If you need a refill on your cardiac medications before your next appointment, please call your pharmacy*   Lab Work:  TSH !!!!  If you have labs (blood work) drawn today and your tests are completely normal, you will receive your results only by: MyChart Message (if you have MyChart) OR A paper copy in the mail If you have any lab test that is abnormal or we need to change your treatment, we will call you to review the results.   Testing/Procedures:   None ordered.   Follow-Up: At [facility], you and your health needs are our priority.  As part  of our continuing mission to provide you with exceptional heart care, we  have created designated Provider Care Teams.  These Care Teams include your primary Cardiologist (physician) and Advanced Practice Providers (APPs -  Physician Assistants and Nurse Practitioners) who all work together to provide you with the care you need, when you need it.  We recommend signing up for the patient portal called "MyChart".  Sign up information is provided on this After Visit Summary.  MyChart is used to connect with patients for Virtual Visits (Telemedicine).  Patients are able to view lab/test results, encounter notes, upcoming appointments, etc.  Non-urgent messages can be sent to your provider as well.   To learn more about what you can do with MyChart, go to [email or web address].    Your next appointment:   6 month(s)  Provider:   Eligha Bridegroom, NP         Other Instructions  Your physician wants you to follow-up in: 6 months with Lebron Conners You will receive a reminder letter in the mail two months in advance. If you don't receive a letter, please call our office to schedule the follow-up appointment.     Signed, Levi Aland, NP  07/22/2022 11:47 AM    [facility]

## 2022-07-19 ENCOUNTER — Ambulatory Visit (INDEPENDENT_AMBULATORY_CARE_PROVIDER_SITE_OTHER): Payer: Medicaid Other | Admitting: Adult Health

## 2022-07-19 ENCOUNTER — Encounter: Payer: Self-pay | Admitting: Adult Health

## 2022-07-19 VITALS — BP 120/80 | HR 105 | Temp 98.1°F | Ht 68.0 in | Wt 282.0 lb

## 2022-07-19 DIAGNOSIS — R6889 Other general symptoms and signs: Secondary | ICD-10-CM | POA: Diagnosis not present

## 2022-07-19 LAB — POCT INFLUENZA A/B
Influenza A, POC: NEGATIVE
Influenza B, POC: NEGATIVE

## 2022-07-19 LAB — IBC + FERRITIN
Ferritin: 77.7 ng/mL (ref 10.0–291.0)
Iron: 80 ug/dL (ref 42–145)
Saturation Ratios: 30.4 % (ref 20.0–50.0)
TIBC: 263.2 ug/dL (ref 250.0–450.0)
Transferrin: 188 mg/dL — ABNORMAL LOW (ref 212.0–360.0)

## 2022-07-19 LAB — POCT RAPID STREP A (OFFICE): Rapid Strep A Screen: NEGATIVE

## 2022-07-19 LAB — POC COVID19 BINAXNOW: SARS Coronavirus 2 Ag: NEGATIVE

## 2022-07-19 MED ORDER — BENZONATATE 200 MG PO CAPS: 200.0000 mg | ORAL_CAPSULE | Freq: Two times a day (BID) | ORAL | 0 refills | Status: DC | PRN

## 2022-07-19 NOTE — Progress Notes (Signed)
Subjective:    Patient ID: Lori [name], female    DOB: 1991-03-12, 32 y.o.   MRN: [id number]  HPI 32 year old female who  has a past medical history of Abnormal uterine bleeding, GERD (gastroesophageal reflux disease), Iron (Fe) deficiency anemia, Migraine, Migraine with aura, Obesity, and PONV (postoperative nausea and vomiting).  She presents to the office today for an an acute issue. Her symptoms started roughly three days ago. Symptoms include that of sore throat, dry cough, body aches, chills, shortness of breath. 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   Denies using anything OTC   She would also like to check her iron levels as she " always feels cold"     Review of Systems See HPI   Past Medical History:  Diagnosis Date   Abnormal uterine bleeding    GERD (gastroesophageal reflux disease)    Iron (Fe) deficiency anemia    Migraine    Migraine with aura    Obesity    PONV (postoperative nausea and vomiting)     Social History   Socioeconomic History   Marital status: Single    Spouse name: Not on file   Number of children: Not on file   Years of education: Not on file   Highest education level: Associate degree: occupational, Hotel manager, or vocational program  Occupational History   Not on file  Tobacco Use   Smoking status: Never   Smokeless tobacco: Never  Vaping Use   Vaping Use: Never used  Substance and Sexual Activity   Alcohol use: Not Currently    Comment: social   Drug use: No   Sexual activity: Yes    Partners: Male    Birth control/protection: Surgical  Other Topics Concern   Not on file  Social History Narrative   She is a Psychologist, counselling    Not married   Two children ( daughter 73 , son 3)       Social Determinants of Health   Financial Resource Strain: Low Risk  (08/22/2021)   Overall Financial Resource Strain (CARDIA)    Difficulty of Paying  Living Expenses: Not hard at all  Food Insecurity: No Food Insecurity (08/22/2021)   Hunger Vital Sign    Worried About Running Out of Food in the Last Year: Never true    Ran Out of Food in the Last Year: Never true  Transportation Needs: No Transportation Needs (08/22/2021)   PRAPARE - Hydrologist (Medical): No    Lack of Transportation (Non-Medical): No  Physical Activity: Insufficiently Active (08/22/2021)   Exercise Vital Sign    Days of Exercise per Week: 3 days    Minutes of Exercise per Session: 30 min  Stress: No Stress Concern Present (08/22/2021)   [place]    Feeling of Stress : Not at all  Social Connections: Moderately Isolated (08/22/2021)   Social Connection and Isolation Panel [NHANES]    Frequency of Communication with Friends and Family: More than three times a week    Frequency of Social Gatherings with Friends and Family: Once a week    Attends Religious Services: More than 4 times per year    Active Member of [employer] or Organizations: No    Attends Archivist Meetings: Not on file    Marital Status: Never married  Intimate Partner Violence: Not on file    Past Surgical History:  Procedure Laterality Date   DILATION AND CURETTAGE, DIAGNOSTIC / THERAPEUTIC  2015   ROBOTIC ASSISTED LAPAROSCOPIC HYSTERECTOMY AND SALPINGECTOMY Bilateral 10/30/2019   Procedure: XI ROBOTIC ASSISTED LAPAROSCOPIC HYSTERECTOMY AND SALPINGECTOMY;  Surgeon: Brien Few, MD;  Location: [place];  Service: Gynecology;  Laterality: Bilateral;  Tracie RNFA confirmed 10/13/19 CS   TUBAL LIGATION  2014   WISDOM TOOTH EXTRACTION      Family History  Problem Relation Age of Onset   Diabetes Mother    Heart disease Mother    Diabetes Father    Kidney disease Father    Asthma Sister    Seizures Sister    Asthma Daughter    Von Willebrand disease Son    Asthma Sister     Diabetes Maternal Grandmother    Heart disease Maternal Grandmother    Diabetes Maternal Grandfather    Diabetes Paternal Grandmother    Lupus Paternal Grandmother    Diabetes Paternal Grandfather     Allergies  Allergen Reactions   Benylin Adult Formula [Dextromethorphan] Shortness Of Breath   Amoxicillin Hives, Itching and Swelling   Benadryl [Diphenhydramine Hcl] Hives   Cefdinir Hives   Eggs Or Egg-Derived Products     Egg free flu vaccine   Tdap [Tetanus-Diphth-Acell Pertussis]    Codeine Itching   Tramadol Itching    Current Outpatient Medications on File Prior to Visit  Medication Sig Dispense Refill   AIMOVIG 140 MG/ML SOAJ INJECT 140 MG INTO THE SKIN EVERY 30 DAYS 1 mL 6   ALPRAZolam (XANAX) 0.25 MG tablet Take 1 tablet (0.25 mg total) by mouth 2 (two) times daily as needed for anxiety. 20 tablet 0   fluconazole (DIFLUCAN) 150 MG tablet Take 1 tablet (150 mg total) by mouth every 3 (three) days as needed. 2 tablet 0   FLUoxetine (PROZAC) 10 MG capsule Take 1 capsule (10 mg total) by mouth daily. 30 capsule 1   furosemide (LASIX) 20 MG tablet Take 1 tablet (20 mg total) by mouth daily. As needed for Leg edema. 90 tablet 3   hydrOXYzine (VISTARIL) 25 MG capsule Take 1 capsule (25 mg total) by mouth at bedtime as needed for anxiety. 30 capsule 1   metoprolol succinate (TOPROL XL) 50 MG 24 hr tablet Take 1 tablet (50 mg total) by mouth every evening. 90 tablet 3   ondansetron (ZOFRAN) 4 MG tablet Take 1 tablet (4 mg total) by mouth every 8 (eight) hours as needed for nausea or vomiting. 20 tablet 2   Rimegepant Sulfate (NURTEC) 75 MG TBDP Take 75 mg by mouth daily as needed. 16 tablet 0   Semaglutide-Weight Management (WEGOVY) 0.25 MG/0.5ML SOAJ Inject 0.25 mg into the skin once a week. 2 mL 2   No current facility-administered medications on file prior to visit.    BP 120/80   Pulse (!) 105   Temp 98.1 F (36.7 C) (Oral)   Ht 5\' 8"  (1.727 m)   Wt 282 lb (127.9 kg)    LMP 10/18/2019 Comment: HYSTERECTOMY 10/18/2019  SpO2 95%   BMI 42.88 kg/m       Objective:   Physical Exam Constitutional:      Appearance: Normal appearance.  HENT:     Right Ear: Tympanic membrane, ear canal and external ear normal. There is no impacted cerumen.     Left Ear: Tympanic membrane, ear canal and external ear normal. There is no  impacted cerumen.     Nose: Nose normal. No congestion or rhinorrhea.     Mouth/Throat:     Mouth: Mucous membranes are moist.     Pharynx: Oropharynx is clear. No oropharyngeal exudate or posterior oropharyngeal erythema.  Cardiovascular:     Rate and Rhythm: Regular rhythm. Tachycardia present.     Pulses: Normal pulses.     Heart sounds: Normal heart sounds.  Pulmonary:     Effort: Pulmonary effort is normal.     Breath sounds: Normal breath sounds.  Musculoskeletal:        General: Normal range of motion.  Skin:    General: Skin is warm and dry.     Capillary Refill: Capillary refill takes less than 2 seconds.  Neurological:     General: No focal deficit present.     Mental Status: She is alert and oriented to person, place, and time.  Psychiatric:        Mood and Affect: Mood normal.        Behavior: Behavior normal.        Thought Content: Thought content normal.        Judgment: Judgment normal.       Assessment & Plan:  1. Flu-like symptoms -  POC testing negative. Advised conservative measures such as rest and hydration. Can take OTC cold medications if needed  - Will send in Tessalon pearls for cough  - POC COVID-19 BinaxNow- negative - POCT Influenza A/B- negative - POCT rapid strep A- negative   2. Cold intolerance  - IBC + Ferritin; Future - IBC + Ferritin  Dorothyann Peng, NP

## 2022-07-21 ENCOUNTER — Ambulatory Visit: Payer: Medicaid Other | Attending: Nurse Practitioner | Admitting: Nurse Practitioner

## 2022-07-21 ENCOUNTER — Encounter: Payer: Self-pay | Admitting: Nurse Practitioner

## 2022-07-21 VITALS — BP 122/84 | HR 99 | Ht 64.0 in | Wt 285.4 lb

## 2022-07-21 DIAGNOSIS — R002 Palpitations: Secondary | ICD-10-CM

## 2022-07-21 DIAGNOSIS — R0602 Shortness of breath: Secondary | ICD-10-CM

## 2022-07-21 DIAGNOSIS — R000 Tachycardia, unspecified: Secondary | ICD-10-CM

## 2022-07-21 DIAGNOSIS — I739 Peripheral vascular disease, unspecified: Secondary | ICD-10-CM

## 2022-07-21 DIAGNOSIS — R600 Localized edema: Secondary | ICD-10-CM

## 2022-07-21 NOTE — Patient Instructions (Signed)
Medication Instructions:   DISCONTINUE Toprol start with (25 mg) for a few days, than decrease (12.5 mg ) for a few days than STOP.  *If you need a refill on your cardiac medications before your next appointment, please call your pharmacy*   Lab Work:  TSH !!!!  If you have labs (blood work) drawn today and your tests are completely normal, you will receive your results only by: [place] (if you have MyChart) OR A paper copy in the mail If you have any lab test that is abnormal or we need to change your treatment, we will call you to review the results.   Testing/Procedures:   None ordered.   Follow-Up: At [facility], you and your health needs are our priority.  As part of our continuing mission to provide you with exceptional heart care, we have created designated Provider Care Teams.  These Care Teams include your primary Cardiologist (physician) and Advanced Practice Providers (APPs -  Physician Assistants and Nurse Practitioners) who all work together to provide you with the care you need, when you need it.  We recommend signing up for the patient portal called "MyChart".  Sign up information is provided on this After Visit Summary.  MyChart is used to connect with patients for Virtual Visits (Telemedicine).  Patients are able to view lab/test results, encounter notes, upcoming appointments, etc.  Non-urgent messages can be sent to your provider as well.   To learn more about what you can do with MyChart, go to [email or web address].    Your next appointment:   6 month(s)  Provider:   Christen Bame, NP         Other Instructions  Your physician wants you to follow-up in: 6 months with Lorenda Peck You will receive a reminder letter in the mail two months in advance. If you don't receive a letter, please call our office to schedule the follow-up appointment.

## 2022-07-22 ENCOUNTER — Encounter: Payer: Self-pay | Admitting: Nurse Practitioner

## 2022-07-22 LAB — TSH: TSH: 0.939 u[IU]/mL (ref 0.450–4.500)

## 2022-12-27 ENCOUNTER — Encounter: Payer: Self-pay | Admitting: Adult Health

## 2022-12-27 ENCOUNTER — Ambulatory Visit: Payer: Medicaid Other | Admitting: Adult Health

## 2022-12-27 VITALS — BP 122/84 | HR 93 | Temp 98.4°F | Ht 64.0 in | Wt 297.0 lb

## 2022-12-27 DIAGNOSIS — E668 Other obesity: Secondary | ICD-10-CM | POA: Diagnosis not present

## 2022-12-27 DIAGNOSIS — Z6841 Body Mass Index (BMI) 40.0 and over, adult: Secondary | ICD-10-CM | POA: Diagnosis not present

## 2022-12-27 DIAGNOSIS — E662 Morbid (severe) obesity with alveolar hypoventilation: Secondary | ICD-10-CM

## 2022-12-27 DIAGNOSIS — R7303 Prediabetes: Secondary | ICD-10-CM

## 2022-12-27 MED ORDER — ONDANSETRON HCL 4 MG PO TABS: 4.0000 mg | ORAL_TABLET | Freq: Three times a day (TID) | ORAL | 2 refills | Status: DC | PRN

## 2022-12-27 MED ORDER — WEGOVY 0.25 MG/0.5ML ~~LOC~~ SOAJ: 0.2500 mg | SUBCUTANEOUS | 0 refills | Status: DC

## 2022-12-27 NOTE — Progress Notes (Signed)
Subjective:    Patient ID: Lori [name], female    DOB: 1990/08/08, 32 y.o.   MRN: [id number]  HPI 32 year old female who  has a past medical history of Abnormal uterine bleeding, GERD (gastroesophageal reflux disease), Iron (Fe) deficiency anemia, Migraine, Migraine with aura, Obesity, and PONV (postoperative nausea and vomiting).  She presents to the office today for weight loss management, obesity and history of prediabetes.   In the past she was on wegovy and tolerated this medication well with a little nausea.  We started titrating her medication and she developed tachycardia.  She was seen by cardiology and was placed on Toprol.  She does not take Toprol on a routine basis.  Reports that she continues to have tachycardia/palpitations intermittently.  She would like to go back on Wegovy to help with weight loss but will titrate slower.  She is eating healthy and exercising on a routine basis but has not been able to lose much weight. Her weight will yo-yo   Wt Readings from Last 3 Encounters:  12/27/22 297 lb (134.7 kg)  07/21/22 285 lb 6.4 oz (129.5 kg)  07/19/22 282 lb (127.9 kg)    Review of Systems See HPI   Past Medical History:  Diagnosis Date   Abnormal uterine bleeding    GERD (gastroesophageal reflux disease)    Iron (Fe) deficiency anemia    Migraine    Migraine with aura    Obesity    PONV (postoperative nausea and vomiting)     Social History   Socioeconomic History   Marital status: Single    Spouse name: Not on file   Number of children: Not on file   Years of education: Not on file   Highest education level: Associate degree: occupational, Scientist, product/process development, or vocational program  Occupational History   Not on file  Tobacco Use   Smoking status: Never   Smokeless tobacco: Never  Vaping Use   Vaping Use: Never used  Substance and Sexual Activity   Alcohol use: Not Currently    Comment: social   Drug use: No   Sexual activity: Yes    Partners:  Male    Birth control/protection: Surgical  Other Topics Concern   Not on file  Social History Narrative   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   Two children ( daughter 38 , son 3)       Social Determinants of Health   Financial Resource Strain: Low Risk  (12/25/2022)   Overall Financial Resource Strain (CARDIA)    Difficulty of Paying Living Expenses: Not hard at all  Food Insecurity: No Food Insecurity (12/25/2022)   Hunger Vital Sign    Worried About Running Out of Food in the Last Year: Never true    Ran Out of Food in the Last Year: Never true  Transportation Needs: No Transportation Needs (12/25/2022)   PRAPARE - Administrator, Civil Service (Medical): No    Lack of Transportation (Non-Medical): No  Physical Activity: Insufficiently Active (12/25/2022)   Exercise Vital Sign    Days of Exercise per Week: 3 days    Minutes of Exercise per Session: 30 min  Stress: No Stress Concern Present (12/25/2022)   [employer] of Occupational Health - Occupational Stress Questionnaire    Feeling of Stress : Not at all  Social Connections: Socially Isolated (12/25/2022)   Social Connection and Isolation Panel [NHANES]    Frequency of Communication with Friends  and Family: More than three times a week    Frequency of Social Gatherings with Friends and Family: More than three times a week    Attends Religious Services: Never    Database administrator or Organizations: No    Attends Engineer, structural: Not on file    Marital Status: Never married  Intimate Partner Violence: Not on file    Past Surgical History:  Procedure Laterality Date   DILATION AND CURETTAGE, DIAGNOSTIC / THERAPEUTIC  2015   ROBOTIC ASSISTED LAPAROSCOPIC HYSTERECTOMY AND SALPINGECTOMY Bilateral 10/30/2019   Procedure: XI ROBOTIC ASSISTED LAPAROSCOPIC HYSTERECTOMY AND SALPINGECTOMY;  Surgeon: Olivia Mackie, MD;  Location: [facility] [place];  Service: Gynecology;  Laterality:  Bilateral;  Tracie RNFA confirmed 10/13/19 CS   TUBAL LIGATION  2014   WISDOM TOOTH EXTRACTION      Family History  Problem Relation Age of Onset   Diabetes Mother    Heart disease Mother    Diabetes Father    Kidney disease Father    Asthma Sister    Seizures Sister    Asthma Daughter    Von Willebrand disease Son    Asthma Sister    Diabetes Maternal Grandmother    Heart disease Maternal Grandmother    Diabetes Maternal Grandfather    Diabetes Paternal Grandmother    Lupus Paternal Grandmother    Diabetes Paternal Grandfather     Allergies  Allergen Reactions   Benylin Adult Formula [Dextromethorphan] Shortness Of Breath   Amoxicillin Hives, Itching and Swelling   Benadryl [Diphenhydramine Hcl] Hives   Cefdinir Hives   Egg-Derived Products     Egg free flu vaccine   Pertussis Vaccine Other (See Comments)   Tdap [Tetanus-Diphth-Acell Pertussis]    Codeine Itching   Tramadol Itching    Current Outpatient Medications on File Prior to Visit  Medication Sig Dispense Refill   AIMOVIG 140 MG/ML SOAJ INJECT 140 MG INTO THE SKIN EVERY 30 DAYS 1 mL 6   ALPRAZolam (XANAX) 0.25 MG tablet Take 1 tablet (0.25 mg total) by mouth 2 (two) times daily as needed for anxiety. 20 tablet 0   benzonatate (TESSALON) 200 MG capsule Take 1 capsule (200 mg total) by mouth 2 (two) times daily as needed for cough. 20 capsule 0   fluconazole (DIFLUCAN) 150 MG tablet Take 1 tablet (150 mg total) by mouth every 3 (three) days as needed. 2 tablet 0   FLUoxetine (PROZAC) 10 MG capsule Take 1 capsule (10 mg total) by mouth daily. 30 capsule 1   furosemide (LASIX) 20 MG tablet Take 1 tablet (20 mg total) by mouth daily. As needed for Leg edema. 90 tablet 3   hydrOXYzine (VISTARIL) 25 MG capsule Take 1 capsule (25 mg total) by mouth at bedtime as needed for anxiety. 30 capsule 1   ondansetron (ZOFRAN) 4 MG tablet Take 1 tablet (4 mg total) by mouth every 8 (eight) hours as needed for nausea or vomiting.  20 tablet 2   Rimegepant Sulfate (NURTEC) 75 MG TBDP Take 75 mg by mouth daily as needed. 16 tablet 0   Semaglutide-Weight Management (WEGOVY) 0.25 MG/0.5ML SOAJ Inject 0.25 mg into the skin once a week. 2 mL 2   No current facility-administered medications on file prior to visit.    Pulse 93   Temp 98.4 F (36.9 C) (Oral)   Ht 5\' 4"  (1.626 m)   Wt 297 lb (134.7 kg)   LMP 10/18/2019 Comment: HYSTERECTOMY 10/18/2019  SpO2 99%  BMI 50.98 kg/m       Objective:   Physical Exam Vitals and nursing note reviewed.  Constitutional:      Appearance: Normal appearance. She is obese.  Cardiovascular:     Rate and Rhythm: Normal rate and regular rhythm.     Pulses: Normal pulses.     Heart sounds: Normal heart sounds.  Neurological:     General: No focal deficit present.     Mental Status: She is alert and oriented to person, place, and time.  Psychiatric:        Mood and Affect: Mood normal.        Thought Content: Thought content normal.        Judgment: Judgment normal.       Assessment & Plan:   1. 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  Dispense: 6 mL; Refill: 0 - Ambulatory referral to General Surgery - ondansetron (ZOFRAN) 4 MG tablet; Take 1 tablet (4 mg total) by mouth every 8 (eight) hours as needed for nausea or vomiting.  Dispense: 20 tablet; Refill: 2  2. Prediabetes  - Semaglutide-Weight Management (WEGOVY) 0.25 MG/0.5ML SOAJ; Inject 0.25 mg into the skin once a week.  Dispense: 6 mL; Refill: 0   Shirline Frees, NP

## 2022-12-28 ENCOUNTER — Telehealth: Payer: Self-pay | Admitting: Adult Health

## 2022-12-28 NOTE — Telephone Encounter (Signed)
April with [facility] is calling they do not accept medicaid as primary insurance for bariatric surgery

## 2022-12-29 NOTE — Telephone Encounter (Signed)
Spoke to pt and she stated that insurance will cover it at [facility] or [facility].   Can we send it out to [facility]? Or do we need a new referral?

## 2023-01-02 ENCOUNTER — Other Ambulatory Visit: Payer: Self-pay | Admitting: Adult Health

## 2023-01-02 DIAGNOSIS — R7303 Prediabetes: Secondary | ICD-10-CM

## 2023-01-02 DIAGNOSIS — E662 Morbid (severe) obesity with alveolar hypoventilation: Secondary | ICD-10-CM

## 2023-01-02 NOTE — Telephone Encounter (Signed)
 Patient notified of update  and verbalized understanding. 

## 2023-01-04 ENCOUNTER — Other Ambulatory Visit (HOSPITAL_COMMUNITY): Payer: Self-pay

## 2023-01-05 ENCOUNTER — Other Ambulatory Visit (HOSPITAL_COMMUNITY): Payer: Self-pay

## 2023-01-05 ENCOUNTER — Telehealth: Payer: Self-pay

## 2023-01-05 NOTE — Telephone Encounter (Signed)
Pharmacy Patient Advocate Encounter   Received notification from CoverMyMeds that prior authorization for Wegovy 0.25MG /0.5ML auto-injectors is required/requested.   Insurance verification completed.   The patient is insured through [facility] [place] .   Per test claim: PA submitted to [facility] Medicaid via CoverMyMeds Key/confirmation #/EOC [id number] Status is pending

## 2023-01-08 NOTE — Telephone Encounter (Signed)
Pharmacy Patient Advocate Encounter  Received notification from [facility] that Prior Authorization for Reginal Lutes has been DENIED because see below..  PA #/Case ID/Reference #: [id number]   Please be advised we currently do not have a Pharmacist to review denials, therefore you will need to process appeals accordingly as needed. Thanks for your support at this time. Contact for appeals are as follows: Phone: [phone], Fax: [phone]

## 2023-01-10 NOTE — Telephone Encounter (Signed)
Patient notified of update  and verbalized understanding. 

## 2023-03-26 ENCOUNTER — Telehealth: Payer: Self-pay | Admitting: Adult Health

## 2023-03-26 NOTE — Telephone Encounter (Signed)
Patient dropped off document  Bariatric PCP Surgery Support Letter , to be filled out by provider. Patient requested to send it back via Call Patient to pick up within ASAP. Document is located in providers tray at front office.Please advise at Mobile [phone] (mobile)

## 2023-03-27 ENCOUNTER — Other Ambulatory Visit: Payer: Self-pay | Admitting: Adult Health

## 2023-03-27 NOTE — Telephone Encounter (Signed)
PPW filled out and handed to patient. Copy placed in scans.

## 2023-03-28 NOTE — Progress Notes (Unsigned)
Subjective:    Patient ID: Lori [name], female    DOB: [birth year], 32 y.o.   MRN: [id number]  HPI 32 year old female who  has a past medical history of Abnormal uterine bleeding, GERD (gastroesophageal reflux disease), Iron (Fe) deficiency anemia, Migraine, Migraine with aura, Obesity, and PONV (postoperative nausea and vomiting).  She presents to the office today for three month follow up regarding weigtt loss management and pre diabetes.   When she was last seen about three months ago she was referred to bariatric surgery.  She was also On Wegovy but at the 0.25 mg dose has in the past as we titrated up she developed tachycardia.   Review of Systems See HPI   Past Medical History:  Diagnosis Date   Abnormal uterine bleeding    GERD (gastroesophageal reflux disease)    Iron (Fe) deficiency anemia    Migraine    Migraine with aura    Obesity    PONV (postoperative nausea and vomiting)     Social History   Socioeconomic History   Marital status: Single    Spouse name: Not on file   Number of children: Not on file   Years of education: Not on file   Highest education level: Associate degree: occupational, Scientist, product/process development, or vocational program  Occupational History   Not on file  Tobacco Use   Smoking status: Never   Smokeless tobacco: Never  Vaping Use   Vaping status: Never Used  Substance and Sexual Activity   Alcohol use: Not Currently    Comment: social   Drug use: No   Sexual activity: Yes    Partners: Male    Birth control/protection: Surgical  Other Topics Concern   Not on file  Social History Narrative   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   Two children ( daughter 41 , son 3)       Social Determinants of Health   Financial Resource Strain: Low Risk  (02/07/2023)   Received from [facility]   Overall Financial Resource Strain (CARDIA)    Difficulty of Paying Living Expenses: Not hard at all  Food Insecurity: No Food Insecurity (02/07/2023)    [facility]   Hunger Vital Sign    Worried About Running Out of Food in the Last Year: Never true    Ran Out of Food in the Last Year: Never true  Transportation Needs: No Transportation Needs (02/07/2023)   Received from [facility] - Transportation    Lack of Transportation (Medical): No    Lack of Transportation (Non-Medical): No  Physical Activity: Insufficiently Active (02/07/2023)   Received from [facility]   Exercise Vital Sign    Days of Exercise per Week: 3 days    Minutes of Exercise per Session: 30 min  Stress: No Stress Concern Present (02/07/2023)   Received from [facility] of Occupational Health - Occupational Stress Questionnaire    Feeling of Stress : Not at all  Social Connections: Socially Integrated (02/07/2023)   Received from [facility]   Social Network    How would you rate your social network (family, work, friends)?: Good participation with social networks  Recent Concern: Social Connections - Socially Isolated (12/25/2022)   Social Connection and Isolation Panel [NHANES]    Frequency of Communication with Friends and Family: More than three times a week    Frequency of Social Gatherings with Friends and Family:  More than three times a week    Attends Religious Services: Never    Active Member of Clubs or Organizations: No    Attends Banker Meetings: Not on file    Marital Status: Never married  Intimate Partner Violence: Not At Risk (02/07/2023)   Received from [facility]   HITS    Over the last 12 months how often did your partner physically hurt you?: 1    Over the last 12 months how often did your partner insult you or talk down to you?: 1    Over the last 12 months how often did your partner threaten you with physical harm?: 1    Over the last 12 months how often did your partner scream or curse at you?: 1    Past Surgical History:  Procedure Laterality Date   DILATION AND  CURETTAGE, DIAGNOSTIC / THERAPEUTIC  2015   ROBOTIC ASSISTED LAPAROSCOPIC HYSTERECTOMY AND SALPINGECTOMY Bilateral 10/30/2019   Procedure: XI ROBOTIC ASSISTED LAPAROSCOPIC HYSTERECTOMY AND SALPINGECTOMY;  Surgeon: Olivia Mackie, MD;  Location: [facility] [place];  Service: Gynecology;  Laterality: Bilateral;  Tracie RNFA confirmed 10/13/19 CS   TUBAL LIGATION  2014   WISDOM TOOTH EXTRACTION      Family History  Problem Relation Age of Onset   Diabetes Mother    Heart disease Mother    Diabetes Father    Kidney disease Father    Asthma Sister    Seizures Sister    Asthma Daughter    Von Willebrand disease Son    Asthma Sister    Diabetes Maternal Grandmother    Heart disease Maternal Grandmother    Diabetes Maternal Grandfather    Diabetes Paternal Grandmother    Lupus Paternal Grandmother    Diabetes Paternal Grandfather     Allergies  Allergen Reactions   Benylin Adult Formula [Dextromethorphan] Shortness Of Breath   Amoxicillin Hives, Itching and Swelling   Benadryl [Diphenhydramine Hcl] Hives   Cefdinir Hives   Egg-Derived Products     Egg free flu vaccine   Pertussis Vaccine Other (See Comments)   Tdap [Tetanus-Diphth-Acell Pertussis]    Codeine Itching   Tramadol Itching    Current Outpatient Medications on File Prior to Visit  Medication Sig Dispense Refill   AIMOVIG 140 MG/ML SOAJ INJECT 140 MG INTO THE SKIN EVERY 30 DAYS 1 mL 6   ALPRAZolam (XANAX) 0.25 MG tablet Take 1 tablet (0.25 mg total) by mouth 2 (two) times daily as needed for anxiety. 20 tablet 0   FLUoxetine (PROZAC) 10 MG capsule Take 1 capsule (10 mg total) by mouth daily. 30 capsule 1   furosemide (LASIX) 20 MG tablet Take 1 tablet (20 mg total) by mouth daily. As needed for Leg edema. 90 tablet 3   hydrOXYzine (VISTARIL) 25 MG capsule Take 1 capsule (25 mg total) by mouth at bedtime as needed for anxiety. 30 capsule 1   ondansetron (ZOFRAN) 4 MG tablet Take 1 tablet (4 mg total) by mouth  every 8 (eight) hours as needed for nausea or vomiting. 20 tablet 2   Rimegepant Sulfate (NURTEC) 75 MG TBDP Take 75 mg by mouth daily as needed. 16 tablet 0   No current facility-administered medications on file prior to visit.    LMP 10/18/2019 Comment: HYSTERECTOMY 10/18/2019      Objective:   Physical Exam Vitals and nursing note reviewed.  Constitutional:      Appearance: Normal appearance.  Cardiovascular:     Rate  and Rhythm: Normal rate and regular rhythm.     Pulses: Normal pulses.     Heart sounds: Normal heart sounds.  Pulmonary:     Effort: Pulmonary effort is normal.     Breath sounds: Normal breath sounds.  Skin:    General: Skin is warm and dry.  Neurological:     General: No focal deficit present.     Mental Status: She is alert and oriented to person, place, and time.  Psychiatric:        Mood and Affect: Mood normal.        Behavior: Behavior normal.        Thought Content: Thought content normal.        Judgment: Judgment normal.           Assessment & Plan:

## 2023-03-29 ENCOUNTER — Encounter: Payer: Medicaid Other | Admitting: Adult Health

## 2023-03-30 NOTE — Progress Notes (Signed)
Erroneous entry

## 2023-04-16 ENCOUNTER — Telehealth: Payer: Self-pay

## 2023-04-16 NOTE — Transitions of Care (Post Inpatient/ED Visit) (Signed)
04/16/2023  Name: Lori [name] MRN: [id number] DOB: 10/10/1990  Today's TOC FU Call Status: Today's TOC FU Call Status:: Successful TOC FU Call Completed TOC FU Call Complete Date: 04/16/23 Patient's Name and Date of Birth confirmed.  Transition Care Management Follow-up Telephone Call Date of Discharge: 04/13/23 Discharge Facility: Other ([facility] Facility) Name of Other ([facility]) Discharge Facility: [facility] Type of Discharge: Inpatient Admission Primary Inpatient Discharge Diagnosis:: "morbid obesity" s/p lap sleeve gastrectomy How have you been since you were released from the hospital?: Same (Pt voices she is doing okay- no pain-mild itching to staples- non s/s on infection, on clear liquid diet- no N&V, up walking-walked 2 miles yest) Any questions or concerns?: Yes Patient Questions/Concerns:: pt voices since surgery she has been having some intermittent dizziness and  her BP has been elevated-running in the 140s- spiked up to the 190-200s while in hospital-states surgeon told her it was related to anesthesia and to let them know if it gets 180 or higher-has surgeon appt on 04/18/23 Patient Questions/Concerns Addressed: Other:, Notified Provider of Patient Questions/Concerns (Pt wil go buy a BP machine today and begin monitoring &recording BPs-hosp f/u appt scheduled with provider during call and pt instructed to bring BP readings to appt-reviewed s/s of worsening condition-when to seek medical attention)  Items Reviewed: Did you receive and understand the discharge instructions provided?: Yes Medications obtained,verified, and reconciled?: Yes (Medications Reviewed) Any new allergies since your discharge?: No Dietary orders reviewed?: Yes Type of Diet Ordered:: clear liquids Do you have support at home?: Yes People in Home: parent(s) Name of Support/Comfort Primary Source: mom  Medications Reviewed Today: Medications Reviewed Today     Reviewed by Charlyn Minerva, RN (Registered Nurse) on 04/16/23 at 1454  Med List Status: <None>   Medication Order Taking? Sig Documenting Provider Last Dose Status Informant  acetaminophen (TYLENOL) 500 MG tablet [id number]  Take 500 mg by mouth every 6 (six) hours as needed for moderate pain (pain score 4-6). [provider]  Active Self  AIMOVIG 140 MG/ML SOAJ [id number] No INJECT 140 MG INTO THE SKIN EVERY 30 DAYS  Patient not taking: Reported on 04/16/2023   Shirline Frees, NP Not Taking Active   ALPRAZolam Prudy Feeler) 0.25 MG tablet [id number] No Take 1 tablet (0.25 mg total) by mouth 2 (two) times daily as needed for anxiety.  Patient not taking: Reported on 04/16/2023   Shirline Frees, NP Not Taking Active   FLUoxetine (PROZAC) 10 MG capsule [id number] No Take 1 capsule (10 mg total) by mouth daily.  Patient not taking: Reported on 04/16/2023   Karsten Ro, MD Not Taking Active   furosemide (LASIX) 20 MG tablet [id number]  Take 1 tablet (20 mg total) by mouth daily. As needed for Leg edema. Swinyer, Zachary George, NP  Expired 03/29/23 2359   hydrOXYzine (VISTARIL) 25 MG capsule [id number] No Take 1 capsule (25 mg total) by mouth at bedtime as needed for anxiety.  Patient not taking: Reported on 04/16/2023   Karsten Ro, MD Not Taking Active   metoCLOPramide (REGLAN) 10 MG tablet [id number] Yes Take 10 mg by mouth every 6 (six) hours as needed for nausea. [provider] Taking Active Self  omeprazole (PRILOSEC) 40 MG capsule [id number] Yes Take 40 mg by mouth daily. [provider] Taking Active Self  ondansetron (ZOFRAN) 4 MG tablet [id number] Yes Take 1 tablet (4 mg total) by mouth every 8 (eight) hours as needed for nausea or vomiting. Shirline Frees, NP Taking Active  Rimegepant Sulfate (NURTEC) 75 MG TBDP [id number] No Take 75 mg by mouth daily as needed.  Patient not taking: Reported on 04/16/2023   Shirline Frees, NP Not Taking Active   ursodiol (ACTIGALL) 300 MG capsule [id number]  Yes Take 300 mg by mouth 2 (two) times daily. Start 14 days after surgery, always take with food. [provider]  Active Self            Home Care and Equipment/Supplies: Were Home Health Services Ordered?: NA Any new equipment or medical supplies ordered?: NA  Functional Questionnaire: Do you need assistance with bathing/showering or dressing?: No Do you need assistance with meal preparation?: No Do you need assistance with eating?: No Do you have difficulty maintaining continence: No Do you need assistance with getting out of bed/getting out of a chair/moving?: No Do you have difficulty managing or taking your medications?: No  Follow up appointments reviewed: PCP Follow-up appointment confirmed?: Yes Date of PCP follow-up appointment?: 04/19/23 Follow-up Provider: [facility] Follow-up appointment confirmed?: Yes Date of Specialist follow-up appointment?: 04/18/23 Follow-Up Specialty Provider:: Dr. Milus Mallick Do you need transportation to your follow-up appointment?: No Do you understand care options if your condition(s) worsen?: Yes-patient verbalized understanding  SDOH Interventions Today    Flowsheet Row Most Recent Value  SDOH Interventions   Food Insecurity Interventions Intervention Not Indicated  Transportation Interventions Intervention Not Indicated       Antionette Fairy, RN,BSN,CCM RN Care Manager Transitions of Care  [facility]  Direct Phone: [phone] Toll Free: [phone] Fax: [phone]

## 2023-04-19 ENCOUNTER — Inpatient Hospital Stay: Payer: Medicaid Other | Admitting: Adult Health

## 2023-04-19 NOTE — Progress Notes (Deleted)
Subjective:    Patient ID: Lori [name], female    DOB: July 23, 1990, 32 y.o.   MRN: [id number]  HPI 32 year old female who  has a past medical history of Abnormal uterine bleeding, GERD (gastroesophageal reflux disease), Iron (Fe) deficiency anemia, Migraine, Migraine with aura, Obesity, and PONV (postoperative nausea and vomiting).  She presents to the office today for TCM visit  Admit Date 04/11/2023 Discharge Date 04/12/2023  She had laparoscopic sleeve gastrectomy done at [facility].  During her admission she had no acute events but was having issues with hypertension and dizziness immediately after surgery.  She is not on any hypertension medications.  Was advised to hold Aimovig for 30 days postop.  Discharge weight was 292 pounds.    Review of Systems     Objective:   Physical Exam        Assessment & Plan:

## 2023-04-20 ENCOUNTER — Ambulatory Visit: Payer: Medicaid Other | Admitting: Adult Health

## 2023-08-17 ENCOUNTER — Ambulatory Visit (INDEPENDENT_AMBULATORY_CARE_PROVIDER_SITE_OTHER): Payer: Medicaid Other | Admitting: Adult Health

## 2023-08-17 VITALS — BP 110/70 | HR 108 | Temp 98.0°F | Ht 64.0 in | Wt 254.2 lb

## 2023-08-17 DIAGNOSIS — R5383 Other fatigue: Secondary | ICD-10-CM | POA: Diagnosis not present

## 2023-08-17 DIAGNOSIS — R002 Palpitations: Secondary | ICD-10-CM | POA: Diagnosis not present

## 2023-08-17 NOTE — Addendum Note (Signed)
 Addended by: Waymon Amato R on: 08/17/2023 04:42 PM   Modules accepted: Orders

## 2023-08-17 NOTE — Addendum Note (Signed)
 Addended by: Gertie Baron D on: 08/17/2023 03:31 PM   Modules accepted: Orders

## 2023-08-17 NOTE — Progress Notes (Signed)
 Subjective:    Patient ID: Lori [name], female    DOB: June 17, 1991, 33 y.o.   MRN: [id number]  HPI 33 year old female who  has a past medical history of Abnormal uterine bleeding, GERD (gastroesophageal reflux disease), Iron (Fe) deficiency anemia, Migraine, Migraine with aura, Obesity, and PONV (postoperative nausea and vomiting).  She presents to the office today for an acute issue. She reports that over the last two weeks she has been experienincing weakness,fatigue, and feeling cold througout the day. Every other day she reports that she will experience palpitations and notice that her heart rate will go up to 168 and this will last 10 minutes - during these episodes she feels lightheaded and more weak.  She did have bariatric surgery in October 2024 and since that time has lost roughly 50 pounds.  She has not had any labs done since her bariatric surgery.  She has also had issues with tachycardia in the past which was thought to be in the setting of Wegovy.  She was seen by cardiology and placed on Toprol 25 mg daily.  She did take this for a period of time but has not experienced any tachycardia up until about 2 weeks ago.  Wt Readings from Last 3 Encounters:  08/17/23 254 lb 3.2 oz (115.3 kg)  12/27/22 297 lb (134.7 kg)  07/21/22 285 lb 6.4 oz (129.5 kg)     Review of Systems See HPI   Past Medical History:  Diagnosis Date   Abnormal uterine bleeding    GERD (gastroesophageal reflux disease)    Iron (Fe) deficiency anemia    Migraine    Migraine with aura    Obesity    PONV (postoperative nausea and vomiting)     Social History   Socioeconomic History   Marital status: Married    Spouse name: Not on file   Number of children: Not on file   Years of education: Not on file   Highest education level: Associate degree: occupational, Scientist, product/process development, or vocational program  Occupational History   Not on file  Tobacco Use   Smoking status: Never   Smokeless tobacco:  Never  Vaping Use   Vaping status: Never Used  Substance and Sexual Activity   Alcohol use: Not Currently    Comment: social   Drug use: No   Sexual activity: Yes    Partners: Male    Birth control/protection: Surgical  Other Topics Concern   Not on file  Social History Narrative   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   Two children ( daughter 64 , son 3)       Social Drivers of Corporate investment banker Strain: Low Risk  (02/07/2023)   Received from [facility]   Overall Financial Resource Strain (CARDIA)    Difficulty of Paying Living Expenses: Not hard at all  Food Insecurity: No Food Insecurity (04/16/2023)   Hunger Vital Sign    Worried About Running Out of Food in the Last Year: Never true    Ran Out of Food in the Last Year: Never true  Transportation Needs: No Transportation Needs (04/16/2023)   PRAPARE - Administrator, Civil Service (Medical): No    Lack of Transportation (Non-Medical): No  Physical Activity: Insufficiently Active (02/07/2023)   [facility] from [facility]   Exercise Vital Sign    Days of Exercise per Week: 3 days    Minutes of Exercise per Session: 30  min  Stress: No Stress Concern Present (04/11/2023)   Received from [facility] - Occupational Stress Questionnaire    Feeling of Stress : Not at all  Social Connections: Socially Integrated (02/07/2023)   Received from [facility]   Social Network    How would you rate your social network (family, work, friends)?: Good participation with social networks  Recent Concern: Social Connections - Socially Isolated (12/25/2022)   Social Connection and Isolation Panel [NHANES]    Frequency of Communication with Friends and Family: More than three times a week    Frequency of Social Gatherings with Friends and Family: More than three times a week    Attends Religious Services: Never    Database administrator or Organizations: No    Attends  Engineer, structural: Not on file    Marital Status: Never married  Intimate Partner Violence: Not At Risk (04/11/2023)   Received from [facility]   HITS    Over the last 12 months how often did your partner physically hurt you?: Never    Over the last 12 months how often did your partner insult you or talk down to you?: Never    Over the last 12 months how often did your partner threaten you with physical harm?: Never    Over the last 12 months how often did your partner scream or curse at you?: Never    Past Surgical History:  Procedure Laterality Date   DILATION AND CURETTAGE, DIAGNOSTIC / THERAPEUTIC  2015   ROBOTIC ASSISTED LAPAROSCOPIC HYSTERECTOMY AND SALPINGECTOMY Bilateral 10/30/2019   Procedure: XI ROBOTIC ASSISTED LAPAROSCOPIC HYSTERECTOMY AND SALPINGECTOMY;  Surgeon: Olivia Mackie, MD;  Location: [facility];  Service: Gynecology;  Laterality: Bilateral;  Tracie RNFA confirmed 10/13/19 CS   TUBAL LIGATION  2014   WISDOM TOOTH EXTRACTION      Family History  Problem Relation Age of Onset   Diabetes Mother    Heart disease Mother    Diabetes Father    Kidney disease Father    Asthma Sister    Seizures Sister    Asthma Daughter    Von Willebrand disease Son    Asthma Sister    Diabetes Maternal Grandmother    Heart disease Maternal Grandmother    Diabetes Maternal Grandfather    Diabetes Paternal Grandmother    Lupus Paternal Grandmother    Diabetes Paternal Grandfather     Allergies  Allergen Reactions   Benylin Adult Formula [Dextromethorphan] Shortness Of Breath   Amoxicillin Hives, Itching and Swelling   Benadryl [Diphenhydramine Hcl] Hives   Cefdinir Hives   Egg-Derived Products     Egg free flu vaccine   Pertussis Vaccine Other (See Comments)   Tdap [Tetanus-Diphth-Acell Pertussis]    Codeine Itching   Tramadol Itching    Current Outpatient Medications on File Prior to Visit  Medication Sig Dispense Refill    acetaminophen (TYLENOL) 500 MG tablet Take 500 mg by mouth every 6 (six) hours as needed for moderate pain (pain score 4-6).     Cholecalciferol 1.25 MG (50000 UT) capsule Take by mouth.     MULTIPLE VITAMINS-MINERALS PO Take by mouth.     No current facility-administered medications on file prior to visit.    BP 110/70   Pulse (!) 108   Temp 98 F (36.7 C) (Oral)   Ht 5\' 4"  (1.626 m)   Wt 254 lb 3.2 oz (115.3 kg)  LMP 10/18/2019 Comment: HYSTERECTOMY 10/18/2019  SpO2 99%   BMI 43.63 kg/m       Objective:   Physical Exam Vitals and nursing note reviewed.  Constitutional:      Appearance: Normal appearance.  Cardiovascular:     Rate and Rhythm: Regular rhythm. Tachycardia present.     Pulses: Normal pulses.     Heart sounds: Normal heart sounds.  Pulmonary:     Effort: Pulmonary effort is normal.     Breath sounds: Normal breath sounds.  Musculoskeletal:        General: Normal range of motion.  Skin:    General: Skin is warm and dry.  Neurological:     General: No focal deficit present.     Mental Status: She is alert and oriented to person, place, and time.  Psychiatric:        Mood and Affect: Mood normal.        Behavior: Behavior normal.        Thought Content: Thought content normal.        Judgment: Judgment normal.       Assessment & Plan:  1. Palpitations (Primary) - Will check labs due to history of bariatric surgery and weight loss. I would imagine she has a iron deficiency causing her symptoms.  - Can consider low dose propanolol for palpitations. With her BP being low I would like to stay away from Metoprolol  - IBC + Ferritin; Future - Comprehensive metabolic panel; Future - Vitamin B12; Future - VITAMIN D 25 Hydroxy (Vit-D Deficiency, Fractures); Future - CBC; Future - TSH; Future - EKG 12-Lead- NSR, Rate 83   2. Other fatigue  - IBC + Ferritin; Future - Comprehensive metabolic panel; Future - Vitamin B12; Future - VITAMIN D 25 Hydroxy  (Vit-D Deficiency, Fractures); Future - CBC; Future - TSH; Future - EKG 12-Lead  Shirline Frees, NP  Time spent with patient today was 32  minutes which consisted of chart review, discussing diagnosis, work up, treatment answering questions and documentation. This did not include time performing an EKG

## 2023-08-18 LAB — COMPREHENSIVE METABOLIC PANEL
AG Ratio: 1.4 (calc) (ref 1.0–2.5)
ALT: 11 U/L (ref 6–29)
AST: 13 U/L (ref 10–30)
Albumin: 3.8 g/dL (ref 3.6–5.1)
Alkaline phosphatase (APISO): 78 U/L (ref 31–125)
BUN: 11 mg/dL (ref 7–25)
CO2: 26 mmol/L (ref 20–32)
Calcium: 8.7 mg/dL (ref 8.6–10.2)
Chloride: 106 mmol/L (ref 98–110)
Creat: 0.58 mg/dL (ref 0.50–0.97)
Globulin: 2.8 g/dL (ref 1.9–3.7)
Glucose, Bld: 86 mg/dL (ref 65–99)
Potassium: 3.8 mmol/L (ref 3.5–5.3)
Sodium: 142 mmol/L (ref 135–146)
Total Bilirubin: 0.4 mg/dL (ref 0.2–1.2)
Total Protein: 6.6 g/dL (ref 6.1–8.1)

## 2023-08-18 LAB — IRON,TIBC AND FERRITIN PANEL
%SAT: 34 % (ref 16–45)
Ferritin: 127 ng/mL (ref 16–154)
Iron: 83 ug/dL (ref 40–190)
TIBC: 242 ug/dL — ABNORMAL LOW (ref 250–450)

## 2023-08-18 LAB — CBC
HCT: 37.4 % (ref 35.0–45.0)
Hemoglobin: 12.2 g/dL (ref 11.7–15.5)
MCH: 27.8 pg (ref 27.0–33.0)
MCHC: 32.6 g/dL (ref 32.0–36.0)
MCV: 85.2 fL (ref 80.0–100.0)
MPV: 12.7 fL — ABNORMAL HIGH (ref 7.5–12.5)
Platelets: 307 10*3/uL (ref 140–400)
RBC: 4.39 10*6/uL (ref 3.80–5.10)
RDW: 12.3 % (ref 11.0–15.0)
WBC: 5.7 10*3/uL (ref 3.8–10.8)

## 2023-08-18 LAB — VITAMIN D 25 HYDROXY (VIT D DEFICIENCY, FRACTURES): Vit D, 25-Hydroxy: 25 ng/mL — ABNORMAL LOW (ref 30–100)

## 2023-08-18 LAB — TSH: TSH: 0.91 m[IU]/L

## 2023-08-18 LAB — VITAMIN B12: Vitamin B-12: 531 pg/mL (ref 200–1100)

## 2023-08-23 ENCOUNTER — Other Ambulatory Visit: Payer: Self-pay

## 2023-08-23 MED ORDER — CHOLECALCIFEROL 1.25 MG (50000 UT) PO CAPS: 50000.0000 [IU] | ORAL_CAPSULE | ORAL | 0 refills | Status: AC

## 2023-11-18 HISTORY — PX: CHOLECYSTECTOMY, LAPAROSCOPIC: SHX56

## 2024-02-01 ENCOUNTER — Telehealth: Payer: Self-pay | Admitting: Adult Health

## 2024-02-01 NOTE — Telephone Encounter (Unsigned)
 Copied from CRM #[id number]. Topic: Clinical - Medical Advice >> Jan 31, 2024  3:39 PM Robinson H wrote: Reason for CRM: Patient calling to speak with Marjorie at office, states she has a medical question regarding an issue she has was only information patient would give agent. Agent did ask if she was in any pain and patient stated no.  [place] [phone]

## 2024-02-01 NOTE — Telephone Encounter (Signed)
 Pt called to see if her husband can get a medication sent to the pharmacy. Pt tested positive for Trichomoniasis and her partner was seen for the same issue. However, pt was not tested for Trichomoniasis. Pt wanting a abx called in for her husband. Per Dr. Johnny verbally it is ok to send in medication. No further action needed.

## 2024-02-14 ENCOUNTER — Other Ambulatory Visit: Payer: Self-pay | Admitting: Adult Health

## 2024-05-20 ENCOUNTER — Ambulatory Visit (INDEPENDENT_AMBULATORY_CARE_PROVIDER_SITE_OTHER): Payer: PRIVATE HEALTH INSURANCE

## 2024-05-20 ENCOUNTER — Encounter: Payer: Self-pay | Admitting: Adult Health

## 2024-05-20 DIAGNOSIS — Z23 Encounter for immunization: Secondary | ICD-10-CM | POA: Diagnosis not present

## 2024-05-20 NOTE — Progress Notes (Signed)
 Patient is in office today for a nurse visit for Influenza Immunization. Patient Injection was given in the  Right deltoid. Patient tolerated injection well.

## 2024-05-21 ENCOUNTER — Ambulatory Visit

## 2024-06-05 ENCOUNTER — Encounter: Payer: Self-pay | Admitting: Adult Health

## 2024-06-05 ENCOUNTER — Telehealth: Payer: PRIVATE HEALTH INSURANCE | Admitting: Adult Health

## 2024-06-05 ENCOUNTER — Telehealth: Payer: Self-pay | Admitting: *Deleted

## 2024-06-05 VITALS — Wt 214.0 lb

## 2024-06-05 DIAGNOSIS — F419 Anxiety disorder, unspecified: Secondary | ICD-10-CM

## 2024-06-05 DIAGNOSIS — F331 Major depressive disorder, recurrent, moderate: Secondary | ICD-10-CM

## 2024-06-05 MED ORDER — ALPRAZOLAM 0.25 MG PO TABS: 0.2500 mg | ORAL_TABLET | Freq: Every day | ORAL | 0 refills | Status: DC

## 2024-06-05 MED ORDER — HYDROXYZINE HCL 25 MG PO TABS: 25.0000 mg | ORAL_TABLET | Freq: Every evening | ORAL | 0 refills | Status: AC | PRN

## 2024-06-05 MED ORDER — FLUOXETINE HCL 20 MG PO CAPS: 20.0000 mg | ORAL_CAPSULE | Freq: Every day | ORAL | 0 refills | Status: DC

## 2024-06-05 MED ORDER — HYDROXYZINE HCL 25 MG PO TABS: 25.0000 mg | ORAL_TABLET | Freq: Every evening | ORAL | 0 refills | Status: DC | PRN

## 2024-06-05 NOTE — Telephone Encounter (Signed)
 Copied from CRM #[id number]. Topic: Clinical - Prescription Issue >> Jun 05, 2024  8:16 AM Donna BRAVO wrote: Reason for CRM:  Patient change of last name married June 30, 2023   Please resend prescription to pharmacy with corrected last name.    ALPRAZolam  (XANAX ) 0.25 MG tablet FLUoxetine  (PROZAC ) 20 MG capsule hydrOXYzine  (ATARAX ) 25 MG tablet   [id number] - [place], [place] - [address] AT [facility] OF [address] [place] [address] Phone: [phone] Fax: [phone] Hours: Open 24 hours

## 2024-06-05 NOTE — Progress Notes (Signed)
 Virtual Visit via Video Note  I connected with Lori [name] on 06/05/2024 at  7:30 AM EST by a video enabled telemedicine application and verified that I am speaking with the correct person using two identifiers.  Location patient: home Location provider:work or home office Persons participating in the virtual visit: patient, provider  I discussed the limitations of evaluation and management by telemedicine and the availability of in person appointments. The patient expressed understanding and agreed to proceed.   HPI: Discussed the use of AI scribe software for clinical note transcription with the patient, who gave verbal consent to proceed.  History of Present Illness   Lori [name] is a 33 year old female with depression and anxiety who presents with worsening symptoms.  She reports a recurrence of depression and anxiety similar to prior episodes, triggered by work stress and conflict with a it consultant. She has been in therapy since June and was advised to restart medication. She previously took Wellbutrin  and Xanax , which improved depression but not anxiety, and recalls using Prozac  in 2023. She took leave from work due to feeling overwhelmed and on edge. She notes palpitations that she associates with anxiety.  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 She is worried about ongoing weight loss and currently weighs 214 pounds and it sounds like she will have to have more surgeries which is weighing on her emotions.     .        ROS: See pertinent positives and negatives per HPI.  Past Medical History:  Diagnosis Date   Abnormal uterine bleeding    GERD (gastroesophageal reflux disease)    Iron (Fe) deficiency anemia    Migraine    Migraine with aura    Obesity    PONV (postoperative nausea and vomiting)     Past Surgical  History:  Procedure Laterality Date   DILATION AND CURETTAGE, DIAGNOSTIC / THERAPEUTIC  2015   ROBOTIC ASSISTED LAPAROSCOPIC HYSTERECTOMY AND SALPINGECTOMY Bilateral 10/30/2019   Procedure: XI ROBOTIC ASSISTED LAPAROSCOPIC HYSTERECTOMY AND SALPINGECTOMY;  Surgeon: Gorge Ade, MD;  Location: [facility] [place];  Service: Gynecology;  Laterality: Bilateral;  Tracie RNFA confirmed 10/13/19 CS   TUBAL LIGATION  2014   WISDOM TOOTH EXTRACTION      Family History  Problem Relation Age of Onset   Diabetes Mother    Heart disease Mother    Diabetes Father    Kidney disease Father    Asthma Sister    Seizures Sister    Asthma Daughter    Von Willebrand disease Son    Asthma Sister    Diabetes Maternal Grandmother    Heart disease Maternal Grandmother    Diabetes Maternal Grandfather    Diabetes Paternal Grandmother    Lupus Paternal Grandmother    Diabetes Paternal Grandfather       Current Medications[1]  EXAM:  VITALS per patient if applicable:  GENERAL: alert, oriented, appears well and in no acute distress  HEENT: atraumatic, conjunttiva clear, no obvious abnormalities on inspection of external nose and ears  NECK: normal movements of the head and neck  LUNGS: on inspection no signs of respiratory distress, breathing rate appears normal, no obvious gross SOB, gasping or wheezing  CV: no obvious cyanosis  MS: moves all visible extremities without noticeable abnormality  PSYCH/NEURO: pleasant and cooperative, no obvious depression or anxiety, speech and thought processing grossly intact  ASSESSMENT AND PLAN:  Discussed the following assessment and plan:  Assessment and Plan    Major depressive disorder, recurrent with anxiety Recurrent depression with anxiety exacerbated by stress and health issues.  PHQ 9 score = 10  GAD 7 score = 13 - Prescribed Prozac  20 mg daily. - Prescribed Xanax  for short-term anxiety management. - Prescribed hydroxyzine  for  nighttime use. - Continue therapy sessions. - Monitor medication response and adjust as needed. - Follow up in one month to assess treatment efficacy.       I discussed the assessment and treatment plan with the patient. The patient was provided an opportunity to ask questions and all were answered. The patient agreed with the plan and demonstrated an understanding of the instructions.   The patient was advised to call back or seek an in-person evaluation if the symptoms worsen or if the condition fails to improve as anticipated.  I personally spent a total of 31 minutes in the care of the patient today including preparing to see the patient, getting/reviewing separately obtained history, performing a medically appropriate exam/evaluation, counseling and educating, placing orders, documenting clinical information in the EHR, and listening .    Darleene Shape, NP      [1]  Current Outpatient Medications:    acetaminophen  (TYLENOL ) 500 MG tablet, Take 500 mg by mouth every 6 (six) hours as needed for moderate pain (pain score 4-6)., Disp: , Rfl:    Cholecalciferol  1.25 MG (50000 UT) capsule, Take 1 capsule (50,000 Units total) by mouth once a week., Disp: 12 capsule, Rfl: 0   MULTIPLE VITAMINS-MINERALS PO, Take by mouth., Disp: , Rfl:

## 2024-06-08 NOTE — Telephone Encounter (Signed)
 Prentice.  I see that you just saw this patient.  Her EGD reveals severe erosive esophagitis while on omeprazole 40 mg bid.  She has a hernia that could benefit from revision.  I believe that she does not want surgery for this as well as revision to a bypass surgery.  She still has issues with chronic diarrhea that I still need to work up her up for.  I will have her continuing with omeprazole 40 mg bid prior to meals.  I will add Pepcid 40 mg to bedtime.  You ordered 24 hours pH with impedance and HRM.  Could you see if this would be done (ON PPI) THANK YOU

## 2024-06-13 NOTE — Telephone Encounter (Signed)
 Make follow up with any APP for further work up of diarrhea

## 2024-06-16 ENCOUNTER — Telehealth: Payer: Self-pay | Admitting: Adult Health

## 2024-06-16 ENCOUNTER — Encounter (HOSPITAL_BASED_OUTPATIENT_CLINIC_OR_DEPARTMENT_OTHER): Payer: Self-pay

## 2024-06-16 ENCOUNTER — Ambulatory Visit: Payer: Self-pay

## 2024-06-16 ENCOUNTER — Ambulatory Visit (INDEPENDENT_AMBULATORY_CARE_PROVIDER_SITE_OTHER): Payer: PRIVATE HEALTH INSURANCE

## 2024-06-16 VITALS — BP 117/83 | HR 92 | Ht 64.0 in | Wt 213.8 lb

## 2024-06-16 DIAGNOSIS — J019 Acute sinusitis, unspecified: Secondary | ICD-10-CM

## 2024-06-16 DIAGNOSIS — R0981 Nasal congestion: Secondary | ICD-10-CM

## 2024-06-16 DIAGNOSIS — B9789 Other viral agents as the cause of diseases classified elsewhere: Secondary | ICD-10-CM

## 2024-06-16 LAB — POC COVID19/FLU A&B COMBO
Covid Antigen, POC: NEGATIVE
Influenza A Antigen, POC: NEGATIVE
Influenza B Antigen, POC: NEGATIVE

## 2024-06-16 NOTE — Telephone Encounter (Signed)
 Pt dropped off paperwork to be signed by Dr. Jud in folder

## 2024-06-16 NOTE — Progress Notes (Signed)
"     Acute Care Office Visit  Subjective:   Lori [name] [birth year] 06/16/2024  Chief Complaint  Patient presents with   Nasal Congestion    Patient is here to be tested for flu and Covid. Has symptoms of congestion, cough, and runny nose.     HPI: States symptoms started with a dry cough on 12/25. States the next day she has had continued congestion, cough, body aches, and chills. Denies known fever. States she has been taking Tylenol  cold and flu for her symptoms without relief. States when she tries to sleep she notices increased congestion. Denies shortness of breath or chest pain. Denies red flag symptoms.   Netty pot  Flonase  sudafed   The following portions of the patient's history were reviewed and updated as appropriate: past medical history, past surgical history, family history, social history, allergies, medications, and problem list.   Patient Active Problem List   Diagnosis Date Noted   Loud snoring 08/18/2021   Menorrhagia 10/30/2019   Abnormal uterine bleeding (AUB) 10/30/2019   Migraine with aura    Past Medical History:  Diagnosis Date   Abnormal uterine bleeding    Anxiety 2022   Depression 2022   GERD (gastroesophageal reflux disease)    Iron (Fe) deficiency anemia    Migraine    Migraine with aura    Obesity    PONV (postoperative nausea and vomiting)    Past Surgical History:  Procedure Laterality Date   ABDOMINAL HYSTERECTOMY  2021   CHOLECYSTECTOMY, LAPAROSCOPIC  11/2023   DILATION AND CURETTAGE, DIAGNOSTIC / THERAPEUTIC  2015   ROBOTIC ASSISTED LAPAROSCOPIC HYSTERECTOMY AND SALPINGECTOMY Bilateral 10/30/2019   Procedure: XI ROBOTIC ASSISTED LAPAROSCOPIC HYSTERECTOMY AND SALPINGECTOMY;  Surgeon: Gorge Ade, MD;  Location: [facility];  Service: Gynecology;  Laterality: Bilateral;  Tracie RNFA confirmed 10/13/19 CS   TUBAL LIGATION  2014   WISDOM TOOTH EXTRACTION     Family History  Problem Relation Age of  Onset   Diabetes Mother    Heart disease Mother    Diabetes Father    Kidney disease Father    Asthma Sister    Seizures Sister    Asthma Daughter    Von Willebrand disease Son    Asthma Sister    Diabetes Maternal Grandmother    Heart disease Maternal Grandmother    Diabetes Maternal Grandfather    Diabetes Paternal Grandmother    Lupus Paternal Grandmother    Diabetes Paternal Grandfather    Outpatient Medications Prior to Visit  Medication Sig Dispense Refill   acetaminophen  (TYLENOL ) 500 MG tablet Take 500 mg by mouth every 6 (six) hours as needed for moderate pain (pain score 4-6).     ALPRAZolam  (XANAX ) 0.25 MG tablet Take 1 tablet (0.25 mg total) by mouth daily. 20 tablet 0   Cholecalciferol  1.25 MG (50000 UT) capsule Take 1 capsule (50,000 Units total) by mouth once a week. 12 capsule 0   FLUoxetine  (PROZAC ) 20 MG capsule Take 1 capsule (20 mg total) by mouth daily. 30 capsule 0   hydrOXYzine  (ATARAX ) 25 MG tablet Take 1 tablet (25 mg total) by mouth at bedtime as needed. 30 tablet 0   MULTIPLE VITAMINS-MINERALS PO Take by mouth.     No facility-administered medications prior to visit.   Allergies[1]   ROS: A complete ROS was performed with pertinent positives/negatives noted in the HPI. The remainder of the ROS are negative.    Objective:   Today's Vitals  06/16/24 1408  BP: 117/83  Pulse: 92  SpO2: 98%  Weight: 213 lb 12.8 oz (97 kg)  Height: 5' 4 (1.626 m)    GENERAL: Well-appearing, in NAD. Well nourished.  SKIN: Pink, warm and dry. No rash, lesion, ulceration, or ecchymoses.  Head: Normocephalic. NECK: Trachea midline. Full ROM w/o pain or tenderness. No lymphadenopathy.  EARS: Tympanic membranes are intact, translucent without bulging and without drainage. Appropriate landmarks visualized. Dried blood noted to edge of R tympanic membrane. EYES: Conjunctiva clear without exudates. EOMI, PERRL, no drainage present.  NOSE: Septum midline w/o deformity.  Nares congested, mucosa pink and non-inflamed w/o drainage. No sinus tenderness.  THROAT: Uvula midline. Oropharynx clear. Tonsils non-inflamed without exudate. Mucous membranes pink and moist.  RESPIRATORY: Chest wall symmetrical. Respirations even and non-labored. Breath sounds clear to auscultation bilaterally.  CARDIAC: S1, S2 present, regular rate and rhythm without murmur or gallops. Peripheral pulses 2+ bilaterally.  PSYCH/MENTAL STATUS: Alert, oriented x 3. Cooperative, appropriate mood and affect.    Results for orders placed or performed in visit on 06/16/24  POC Covid19/Flu A&B Antigen  Result Value Ref Range   Influenza A Antigen, POC Negative Negative   Influenza B Antigen, POC Negative Negative   Covid Antigen, POC Negative Negative      Assessment & Plan:  1. Acute viral sinusitis (Primary) Discussed signs and symptoms of viral infection as well as symptom management. Discussed use of over the counter medication to help with symptoms.   2. Congestion of nasal sinus Negative for both. - POC Covid19/Flu A&B Antigen  Discussed following up with PCP in approximately 6 weeks to check for resolution of dried blood noted to patient's right ear. Per patient she felt a irritation to that ear but was told nothing was wrong. All landmarks are otherwise normal. TM intact without bulging. No erythema or active drainage noted.   Return if symptoms worsen or fail to improve.    Patient to reach out to office if new, worrisome, or unresolved symptoms arise or if no improvement in patient's condition. Patient verbalized understanding and is agreeable to treatment plan. All questions answered to patient's satisfaction.    Lauraine Almarie Angus DNP, FNP-C       [1]  Allergies Allergen Reactions   Benylin Adult Formula [Dextromethorphan] Shortness Of Breath   Amoxicillin Hives, Itching and Swelling   Benadryl [Diphenhydramine Hcl] Hives   Cefdinir Hives   Egg  Protein-Containing Drug Products     Egg free flu vaccine   Pertussis Vaccine Other (See Comments)   Tdap [Tetanus-Diphth-Acell Pertussis]    Codeine Itching   Tramadol Itching   "

## 2024-06-16 NOTE — Telephone Encounter (Signed)
 No openings at PCP office today so patient is scheduled at [facility] with Lauraine Angus FNP at 2:10pm   Patient states that she is going to drop off leave of absence paperwork at her PCP office either today or tomorrow for Lori Shape, NP to fill out for previous leave that he is aware of.   FYI Only or Action Required?: Action required by provider: update on patient condition and request for documentation or forms.  Patient was last seen in primary care on 06/05/2024 by Lori Darleene, NP.  Called Nurse Triage reporting Cough.  Symptoms began 06/12/2024.  Interventions attempted: OTC medications: Tylenol  Cold and Flu Severe, cough drops, Elderberry and Rest, hydration, or home remedies.  Symptoms are: gradually worsening.  Triage Disposition: See Physician Within 24 Hours  Patient/caregiver understands and will follow disposition?: Yes                    Copied from CRM #[id number]. 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 Reason for Disposition  [1] Continuous (nonstop) coughing interferes with work or school AND [2] no improvement using cough treatment per Care Advice  Answer Assessment - Initial Assessment Questions Cough started on 06/12/2024 and then sneezing, coughing, body aches Patient states she hasn't eaten or drank much.  Patient states she just doesn't feel like eating. 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 Unknown fevers--unsure where her thermometer is Patient has been taking Tylenol  Cold and Flu Severe, other cold and flu medicine over the counter, Elderberry Wants to be swabbed for flu and covid.  No openings in PCP office today so patient is scheduled  at [facility] today.  Patient states that she is going to drop off leave of absence paperwork at her PCP office either today or tomorrow for Lori Shape, NP to fill out for previous leave that he is aware of.  Patient is advised to call us  back if anything changes or with any further questions/concerns. Patient is advised that if anything worsens to go to the Emergency Room. Patient verbalized understanding.   3. SPUTUM: Describe the color of your sputum (e.g., none, dry cough; clear, white, yellow, green)     yellow 4. HEMOPTYSIS: Are you coughing up any blood? If Yes, ask: How much? (e.g., flecks, streaks, tablespoons, etc.)     Denies 5. DIFFICULTY BREATHING: Are you having difficulty breathing? If Yes, ask: How bad is it? (e.g., mild, moderate, severe)      Patient denies chest tightness 6. FEVER: Do you have a fever? If Yes, ask: What is your temperature, how was it measured, and when did it start?     Unknown 7. CARDIAC HISTORY: Do you have any history of heart disease? (e.g., heart attack, congestive heart failure)      Denies 8. LUNG HISTORY: Do you have any history of lung disease?  (e.g., pulmonary embolus, asthma, emphysema)     Denies 9. PE RISK FACTORS: Do you have a history of blood clots? (or: recent major surgery, recent prolonged travel, bedridden)     Denies 10. OTHER SYMPTOMS: Do you have any other symptoms? (e.g., runny nose, wheezing, chest pain)  Sneezing, runny nose, congestion 11. PREGNANCY: Is there any chance you are pregnant? When was your last menstrual period?       No--tubal ligation and hysterectomy 12. TRAVEL: Have you traveled out of the country in the last month? (e.g., travel history, exposures)       No  Protocols used: Cough - Acute Productive-A-AH

## 2024-06-16 NOTE — Patient Instructions (Signed)
 Please begin using:  Sudafed Flonase (2 sprays daily) Netty pot   Stay Well hydrated with increased fluids.  Follow up with PcP in about 6 weeks.

## 2024-06-17 NOTE — Telephone Encounter (Signed)
Noted! Placed on providers desk.

## 2024-06-17 NOTE — Telephone Encounter (Signed)
 Ppw given to provider

## 2024-06-17 NOTE — Telephone Encounter (Signed)
 Per provider pt needs to schedule a vv. Pt has been scheduled.

## 2024-06-24 ENCOUNTER — Encounter: Payer: Self-pay | Admitting: Adult Health

## 2024-06-24 ENCOUNTER — Telehealth (INDEPENDENT_AMBULATORY_CARE_PROVIDER_SITE_OTHER): Admitting: Adult Health

## 2024-06-24 VITALS — Ht 64.0 in | Wt 209.0 lb

## 2024-06-24 DIAGNOSIS — F331 Major depressive disorder, recurrent, moderate: Secondary | ICD-10-CM | POA: Diagnosis not present

## 2024-06-24 DIAGNOSIS — F419 Anxiety disorder, unspecified: Secondary | ICD-10-CM | POA: Diagnosis not present

## 2024-06-24 DIAGNOSIS — K649 Unspecified hemorrhoids: Secondary | ICD-10-CM | POA: Diagnosis not present

## 2024-06-24 DIAGNOSIS — K449 Diaphragmatic hernia without obstruction or gangrene: Secondary | ICD-10-CM

## 2024-06-24 DIAGNOSIS — R197 Diarrhea, unspecified: Secondary | ICD-10-CM

## 2024-06-24 NOTE — Progress Notes (Signed)
 Virtual Visit via Video Note  I connected with Lori [name] on 06/24/2024 at  5:00 PM EST by a video enabled telemedicine application and verified that I am speaking with the correct person using two identifiers.  Location patient: home Location provider:work or home office Persons participating in the virtual visit: patient, provider  I discussed the limitations of evaluation and management by telemedicine and the availability of in person appointments. The patient expressed understanding and agreed to proceed.   HPI:  34 year old female who  has a past medical history of Abnormal uterine bleeding, Anxiety (2022), Depression (2022), GERD (gastroesophageal reflux disease), Iron (Fe) deficiency anemia, Migraine, Migraine with aura, Obesity, and PONV (postoperative nausea and vomiting).  She has a history of anxiety and depression which is currently being treated by myself. She also has a history of GERD, diarrhea, hiatal hernia and hemorrhoids that is being managed by GI and general surgery. She needs some leave of absence paperwork filled out to cover her for her ongoing medical issues.    ROS: See pertinent positives and negatives per HPI.  Past Medical History:  Diagnosis Date   Abnormal uterine bleeding    Anxiety 2022   Depression 2022   GERD (gastroesophageal reflux disease)    Iron (Fe) deficiency anemia    Migraine    Migraine with aura    Obesity    PONV (postoperative nausea and vomiting)     Past Surgical History:  Procedure Laterality Date   ABDOMINAL HYSTERECTOMY  2021   CHOLECYSTECTOMY, LAPAROSCOPIC  11/2023   DILATION AND CURETTAGE, DIAGNOSTIC / THERAPEUTIC  2015   ROBOTIC ASSISTED LAPAROSCOPIC HYSTERECTOMY AND SALPINGECTOMY Bilateral 10/30/2019   Procedure: XI ROBOTIC ASSISTED LAPAROSCOPIC HYSTERECTOMY AND SALPINGECTOMY;  Surgeon: Gorge Ade, MD;  Location: [facility];  Service: Gynecology;  Laterality: Bilateral;  Tracie RNFA confirmed 10/13/19  CS   TUBAL LIGATION  2014   WISDOM TOOTH EXTRACTION      Family History  Problem Relation Age of Onset   Diabetes Mother    Heart disease Mother    Diabetes Father    Kidney disease Father    Asthma Sister    Seizures Sister    Asthma Daughter    Von Willebrand disease Son    Asthma Sister    Diabetes Maternal Grandmother    Heart disease Maternal Grandmother    Diabetes Maternal Grandfather    Diabetes Paternal Grandmother    Lupus Paternal Grandmother    Diabetes Paternal Grandfather       Current Medications[1]  EXAM:  VITALS per patient if applicable:  GENERAL: alert, oriented, appears well and in no acute distress  HEENT: atraumatic, conjunttiva clear, no obvious abnormalities on inspection of external nose and ears  NECK: normal movements of the head and neck  LUNGS: on inspection no signs of respiratory distress, breathing rate appears normal, no obvious gross SOB, gasping or wheezing  CV: no obvious cyanosis  MS: moves all visible extremities without noticeable abnormality  PSYCH/NEURO: pleasant and cooperative, no obvious depression or anxiety, speech and thought processing grossly intact  ASSESSMENT AND PLAN:  Discussed the following assessment and plan:  1. Anxiety (Primary) - LOA paperwork filled out with the patient.  - Continue with current treatent   2. Moderate episode of recurrent major depressive disorder (HCC) - Continue with current treatment  3. Diarrhea, unspecified type - Follow up with GI as directed  4. Hiatal hernia - Follow up with general surgery as directed  5. Hemorrhoids, unspecified hemorrhoid type - Follow up with general surgery as directed   Lori Valdes, NP       I discussed the assessment and treatment plan with the patient. The patient was provided an opportunity to ask questions and all were answered. The patient agreed with the plan and demonstrated an understanding of the instructions.   The patient  was advised to call back or seek an in-person evaluation if the symptoms worsen or if the condition fails to improve as anticipated.   Lori Shape, NP     [1]  Current Outpatient Medications:    acetaminophen  (TYLENOL ) 500 MG tablet, Take 500 mg by mouth every 6 (six) hours as needed for moderate pain (pain score 4-6)., Disp: , Rfl:    ALPRAZolam  (XANAX ) 0.25 MG tablet, Take 1 tablet (0.25 mg total) by mouth daily., Disp: 20 tablet, Rfl: 0   Cholecalciferol  1.25 MG (50000 UT) capsule, Take 1 capsule (50,000 Units total) by mouth once a week., Disp: 12 capsule, Rfl: 0   FLUoxetine  (PROZAC ) 20 MG capsule, Take 1 capsule (20 mg total) by mouth daily., Disp: 30 capsule, Rfl: 0   hydrOXYzine  (ATARAX ) 25 MG tablet, Take 1 tablet (25 mg total) by mouth at bedtime as needed., Disp: 30 tablet, Rfl: 0   MULTIPLE VITAMINS-MINERALS PO, Take by mouth., Disp: , Rfl:

## 2024-07-03 ENCOUNTER — Encounter: Payer: Self-pay | Admitting: Adult Health

## 2024-07-03 ENCOUNTER — Telehealth: Payer: PRIVATE HEALTH INSURANCE | Admitting: Adult Health

## 2024-07-03 VITALS — Ht 64.0 in | Wt 209.0 lb

## 2024-07-03 DIAGNOSIS — F331 Major depressive disorder, recurrent, moderate: Secondary | ICD-10-CM

## 2024-07-03 DIAGNOSIS — F419 Anxiety disorder, unspecified: Secondary | ICD-10-CM | POA: Diagnosis not present

## 2024-07-03 MED ORDER — FLUOXETINE HCL 40 MG PO CAPS: 40.0000 mg | ORAL_CAPSULE | Freq: Every day | ORAL | 0 refills | Status: DC

## 2024-07-03 NOTE — Progress Notes (Signed)
 Virtual Visit via Video Note  I connected with Lori [name]  on 07/03/24 at  8:00 AM EST by a video enabled telemedicine application and verified that I am speaking with the correct person using two identifiers.  Location patient: home Location provider:work or home office Persons participating in the virtual visit: patient, provider  I discussed the limitations of evaluation and management by telemedicine and the availability of in person appointments. The patient expressed understanding and agreed to proceed.   HPI: Discussed the use of AI scribe software for clinical note transcription with the patient, who gave verbal consent to proceed.  History of Present Illness   Lori [name] is a 34 year old female with recurrent depression and anxiety who presents for follow-up of her mental health management.  She describes a recurrence of depression and anxiety similar to prior episodes, triggered by work stress and conflict with a it consultant. Anxiety makes it hard for her to leave the house, and she mainly stays home except for necessary appointments, which limits visits with family and trips to stores.  She has taken Prozac  20 mg daily for the past month and used Xanax  about four times as needed for acute anxiety, without side effects from Prozac .  She attends therapy about once to twice weekly and continues to have anxiety related to ongoing work stress and leaving the home.   Current medications are Prozac  20 mg daily, Xanax  as needed for anxiety, and hydroxyzine  25 mg QHS PRN,        ROS: See pertinent positives and negatives per HPI.  Past Medical History:  Diagnosis Date   Abnormal uterine bleeding    Anxiety 2022   Depression 2022   GERD (gastroesophageal reflux disease)    Iron (Fe) deficiency anemia    Migraine    Migraine with aura    Obesity    PONV (postoperative nausea and vomiting)     Past Surgical History:  Procedure Laterality Date   ABDOMINAL  HYSTERECTOMY  2021   CHOLECYSTECTOMY, LAPAROSCOPIC  11/2023   DILATION AND CURETTAGE, DIAGNOSTIC / THERAPEUTIC  2015   ROBOTIC ASSISTED LAPAROSCOPIC HYSTERECTOMY AND SALPINGECTOMY Bilateral 10/30/2019   Procedure: XI ROBOTIC ASSISTED LAPAROSCOPIC HYSTERECTOMY AND SALPINGECTOMY;  Surgeon: Gorge Ade, MD;  Location: [facility];  Service: Gynecology;  Laterality: Bilateral;  Tracie RNFA confirmed 10/13/19 CS   TUBAL LIGATION  2014   WISDOM TOOTH EXTRACTION      Family History  Problem Relation Age of Onset   Diabetes Mother    Heart disease Mother    Diabetes Father    Kidney disease Father    Asthma Sister    Seizures Sister    Asthma Daughter    Von Willebrand disease Son    Asthma Sister    Diabetes Maternal Grandmother    Heart disease Maternal Grandmother    Diabetes Maternal Grandfather    Diabetes Paternal Grandmother    Lupus Paternal Grandmother    Diabetes Paternal Grandfather       Current Medications[1]  EXAM:  VITALS per patient if applicable:  GENERAL: alert, oriented, appears well and in no acute distress  HEENT: atraumatic, conjunttiva clear, no obvious abnormalities on inspection of external nose and ears  NECK: normal movements of the head and neck  LUNGS: on inspection no signs of respiratory distress, breathing rate appears normal, no obvious gross SOB, gasping or wheezing  CV: no obvious cyanosis  MS: moves all visible extremities without noticeable abnormality  PSYCH/NEURO:  pleasant and cooperative, no obvious depression or anxiety, speech and thought processing grossly intact  ASSESSMENT AND PLAN:  Discussed the following assessment and plan:  Assessment and Plan    Major depressive disorder, recurrent, moderate with Generalized anxiety Recurrent depression and anxiety exacerbated by work stress. Prozac  increased to manage anxiety. No side effects reported. Xanax  used sparingly. Therapy ongoing. - Increased Prozac  to  40 mg daily. - Continue therapy sessions as scheduled. - Follow-up in three weeks to assess response to medication adjustment.        I discussed the assessment and treatment plan with the patient. The patient was provided an opportunity to ask questions and all were answered. The patient agreed with the plan and demonstrated an understanding of the instructions.   The patient was advised to call back or seek an in-person evaluation if the symptoms worsen or if the condition fails to improve as anticipated.   Darleene Shape, NP   I personally spent a total of 31 minutes in the care of the patient today including preparing to see the patient, getting/reviewing separately obtained history, performing a medically appropriate exam/evaluation, counseling and educating, placing orders, documenting clinical information in the EHR, and listening.     [1]  Current Outpatient Medications:    ALPRAZolam  (XANAX ) 0.25 MG tablet, Take 1 tablet (0.25 mg total) by mouth daily., Disp: 20 tablet, Rfl: 0   Cholecalciferol  1.25 MG (50000 UT) capsule, Take 1 capsule (50,000 Units total) by mouth once a week., Disp: 12 capsule, Rfl: 0   cholestyramine (QUESTRAN) 4 g packet, Take 4 g by mouth., Disp: , Rfl:    famotidine (PEPCID) 40 MG tablet, Take 40 mg by mouth at bedtime., Disp: , Rfl:    FLUoxetine  (PROZAC ) 20 MG capsule, Take 1 capsule (20 mg total) by mouth daily., Disp: 30 capsule, Rfl: 0   hydrOXYzine  (ATARAX ) 25 MG tablet, Take 1 tablet (25 mg total) by mouth at bedtime as needed., Disp: 30 tablet, Rfl: 0   MULTIPLE VITAMINS-MINERALS PO, Take by mouth., Disp: , Rfl:    omeprazole (PRILOSEC) 40 MG capsule, Take 40 mg by mouth., Disp: , Rfl:

## 2024-07-23 ENCOUNTER — Encounter: Payer: Self-pay | Admitting: Adult Health

## 2024-07-23 ENCOUNTER — Telehealth: Admitting: Adult Health

## 2024-07-23 DIAGNOSIS — F419 Anxiety disorder, unspecified: Secondary | ICD-10-CM

## 2024-07-23 DIAGNOSIS — R159 Full incontinence of feces: Secondary | ICD-10-CM

## 2024-07-23 DIAGNOSIS — F331 Major depressive disorder, recurrent, moderate: Secondary | ICD-10-CM

## 2024-07-23 MED ORDER — FLUOXETINE HCL 20 MG PO CAPS: 20.0000 mg | ORAL_CAPSULE | Freq: Every day | ORAL | 1 refills | Status: AC

## 2024-07-23 MED ORDER — ALPRAZOLAM 0.25 MG PO TABS: 0.2500 mg | ORAL_TABLET | Freq: Every day | ORAL | 0 refills | Status: AC

## 2024-07-23 NOTE — Progress Notes (Signed)
 Virtual Visit via Video Note  I connected with Lori [name] on 07/23/24 at  8:30 AM EST by a video enabled telemedicine application and verified that I am speaking with the correct person using two identifiers.  Location patient: home Location provider:work or home office Persons participating in the virtual visit: patient, provider  I discussed the limitations of evaluation and management by telemedicine and the availability of in person appointments. The patient expressed understanding and agreed to proceed.   HPI:  Discussed the use of AI scribe software for clinical note transcription with the patient, who gave verbal consent to proceed.  History of Present Illness   Lori [name] is a 34 year old female who presents with worsening bowel incontinence and increased fatigue after a medication adjustment.  Over the past week she has had worsening bowel incontinence with three episodes of diarrhea and inability to control bowel movements, with stool coming so quickly she cannot reach a bathroom, including accidents outside the home. Episodes occur even without recent food or fluid intake. She has taken Imodium up to four times daily without relief. A surgeon prescribed a fiber supplement, which she has not yet started.  Three weeks ago her Prozac  was increased from 20 mg to 40 mg. Since then she has had marked fatigue and difficulty waking, feels drained, and has more trouble with daily activities. Her anxiety and depression have not improved with the higher dose. She has not been using Xanax  because she is mostly at home and sleeping more.  She attends talk therapy regularly, including a session yesterday, and is planning another today. She is considering moving out of state due to dissatisfaction with her current living situation and plans to stay locally until her children finish the school year, which she is actively processing in therapy.          07/23/2024    8:20 AM 07/03/2024     7:44 AM 06/05/2024    7:18 AM  PHQ9 SCORE ONLY  PHQ-9 Total Score 13 3 10       07/23/2024    8:23 AM 07/03/2024    7:46 AM 06/05/2024    7:20 AM  GAD 7 : Generalized Anxiety Score  Nervous, Anxious, on Edge 3 0  3   Control/stop worrying 1 0  3   Worry too much - different things 3 1  1    Trouble relaxing 1 1  0   Restless 0 0  0   Easily annoyed or irritable 1 1  3    Afraid - awful might happen 1 0  3   Total GAD 7 Score 10 3 13   Anxiety Difficulty Somewhat difficult Somewhat difficult Very difficult     Data saved with a previous flowsheet row definition      ROS: See pertinent positives and negatives per HPI.  Past Medical History:  Diagnosis Date   Abnormal uterine bleeding    Anxiety 2022   Depression 2022   GERD (gastroesophageal reflux disease)    Iron (Fe) deficiency anemia    Migraine    Migraine with aura    Obesity    PONV (postoperative nausea and vomiting)     Past Surgical History:  Procedure Laterality Date   ABDOMINAL HYSTERECTOMY  2021   CHOLECYSTECTOMY, LAPAROSCOPIC  11/2023   DILATION AND CURETTAGE, DIAGNOSTIC / THERAPEUTIC  2015   ROBOTIC ASSISTED LAPAROSCOPIC HYSTERECTOMY AND SALPINGECTOMY Bilateral 10/30/2019   Procedure: XI ROBOTIC ASSISTED LAPAROSCOPIC HYSTERECTOMY AND SALPINGECTOMY;  Surgeon: Gorge Ade,  MD;  Location: [facility];  Service: Gynecology;  Laterality: Bilateral;  Tracie RNFA confirmed 10/13/19 CS   TUBAL LIGATION  2014   WISDOM TOOTH EXTRACTION      Family History  Problem Relation Age of Onset   Diabetes Mother    Heart disease Mother    Diabetes Father    Kidney disease Father    Asthma Sister    Seizures Sister    Asthma Daughter    Von Willebrand disease Son    Asthma Sister    Diabetes Maternal Grandmother    Heart disease Maternal Grandmother    Diabetes Maternal Grandfather    Diabetes Paternal Grandmother    Lupus Paternal Grandmother    Diabetes Paternal Grandfather       Current  Medications[1]  EXAM:  VITALS per patient if applicable:  GENERAL: alert, oriented, appears well and in no acute distress  HEENT: atraumatic, conjunttiva clear, no obvious abnormalities on inspection of external nose and ears  NECK: normal movements of the head and neck  LUNGS: on inspection no signs of respiratory distress, breathing rate appears normal, no obvious gross SOB, gasping or wheezing  CV: no obvious cyanosis  MS: moves all visible extremities without noticeable abnormality  PSYCH/NEURO: pleasant and cooperative, no obvious depression or anxiety, speech and thought processing grossly intact  ASSESSMENT AND PLAN:  Discussed the following assessment and plan:  Assessment and Plan    Major depressive disorder and anxiety Increased Prozac  dosage worsened symptoms. In the past Wellbutrin  effective for depression but not anxiety. She agreed to psychiatric referral. - Referred to psychiatry for further management. - Reduced Prozac  to 20 mg until seen by psychiatry. - Refilled Xanax  for as-needed use.  Bowel incontinence with diarrhea Bowel incontinence with diarrhea, possibly related to increased Prozac  dosage. Current management ineffective. Scheduled for hemorrhoid removal surgery. - Continue fiber supplements as prescribed by GI specialist. - Continue Imodium, up to four tablets per day.          I discussed the assessment and treatment plan with the patient. The patient was provided an opportunity to ask questions and all were answered. The patient agreed with the plan and demonstrated an understanding of the instructions.   The patient was advised to call back or seek an in-person evaluation if the symptoms worsen or if the condition fails to improve as anticipated.   Ehtan Delfavero, NP   I personally spent a total of 32 minutes in the care of the patient today including preparing to see the patient, getting/reviewing separately obtained history, performing a  medically appropriate exam/evaluation, placing orders, and documenting clinical information in the EHR.     [1]  Current Outpatient Medications:    ALPRAZolam  (XANAX ) 0.25 MG tablet, Take 1 tablet (0.25 mg total) by mouth daily., Disp: 20 tablet, Rfl: 0   Calcium Polycarbophil (FIBER) 625 MG TABS, Take 625 mg by mouth., Disp: , Rfl:    Cholecalciferol  1.25 MG (50000 UT) capsule, Take 1 capsule (50,000 Units total) by mouth once a week., Disp: 12 capsule, Rfl: 0   cholestyramine (QUESTRAN) 4 g packet, Take 4 g by mouth., Disp: , Rfl:    famotidine (PEPCID) 40 MG tablet, Take 40 mg by mouth at bedtime., Disp: , Rfl:    FLUoxetine  (PROZAC ) 40 MG capsule, Take 1 capsule (40 mg total) by mouth daily., Disp: 90 capsule, Rfl: 0   hydrOXYzine  (ATARAX ) 25 MG tablet, Take 1 tablet (25 mg total) by mouth at bedtime  as needed., Disp: 30 tablet, Rfl: 0   MULTIPLE VITAMINS-MINERALS PO, Take by mouth., Disp: , Rfl:    omeprazole (PRILOSEC) 40 MG capsule, Take 40 mg by mouth., Disp: , Rfl:

## 2024-07-25 ENCOUNTER — Encounter: Payer: Self-pay | Admitting: Adult Health
